# Patient Record
Sex: Female | Born: 1965
Health system: Southern US, Community
[De-identification: ages and names within clinical notes are randomized; demographics above are authoritative.]

## PROBLEM LIST (undated history)

## (undated) DIAGNOSIS — H539 Unspecified visual disturbance: Secondary | ICD-10-CM

## (undated) DIAGNOSIS — M1990 Unspecified osteoarthritis, unspecified site: Secondary | ICD-10-CM

## (undated) DIAGNOSIS — Z800 Family history of malignant neoplasm of digestive organs: Secondary | ICD-10-CM

## (undated) DIAGNOSIS — T8859XA Other complications of anesthesia, initial encounter: Secondary | ICD-10-CM

## (undated) DIAGNOSIS — I10 Essential (primary) hypertension: Secondary | ICD-10-CM

## (undated) DIAGNOSIS — R0602 Shortness of breath: Secondary | ICD-10-CM

## (undated) DIAGNOSIS — F419 Anxiety disorder, unspecified: Secondary | ICD-10-CM

## (undated) DIAGNOSIS — R002 Palpitations: Secondary | ICD-10-CM

## (undated) DIAGNOSIS — R42 Dizziness and giddiness: Secondary | ICD-10-CM

## (undated) DIAGNOSIS — Z803 Family history of malignant neoplasm of breast: Secondary | ICD-10-CM

## (undated) DIAGNOSIS — D649 Anemia, unspecified: Secondary | ICD-10-CM

## (undated) DIAGNOSIS — H9319 Tinnitus, unspecified ear: Secondary | ICD-10-CM

## (undated) DIAGNOSIS — M545 Low back pain, unspecified: Secondary | ICD-10-CM

## (undated) DIAGNOSIS — G43909 Migraine, unspecified, not intractable, without status migrainosus: Secondary | ICD-10-CM

## (undated) HISTORY — DX: Anxiety disorder, unspecified: F41.9

## (undated) HISTORY — DX: Unspecified osteoarthritis, unspecified site: M19.90

## (undated) HISTORY — DX: Migraine, unspecified, not intractable, without status migrainosus: G43.909

## (undated) HISTORY — DX: Unspecified visual disturbance: H53.9

## (undated) HISTORY — DX: Palpitations: R00.2

## (undated) HISTORY — DX: Essential (primary) hypertension: I10

## (undated) HISTORY — DX: Low back pain, unspecified: M54.50

## (undated) HISTORY — DX: Tinnitus, unspecified ear: H93.19

## (undated) HISTORY — DX: Family history of malignant neoplasm of digestive organs: Z80.0

## (undated) HISTORY — PX: TUBAL LIGATION: SHX77

## (undated) HISTORY — DX: Anemia, unspecified: D64.9

## (undated) HISTORY — DX: Dizziness and giddiness: R42

## (undated) HISTORY — DX: Family history of malignant neoplasm of breast: Z80.3

## (undated) HISTORY — PX: WISDOM TOOTH EXTRACTION: SHX21

## (undated) HISTORY — DX: Shortness of breath: R06.02

---

## 2000-01-06 ENCOUNTER — Other Ambulatory Visit: Admission: RE | Admit: 2000-01-06 | Discharge: 2000-01-06 | Payer: Self-pay | Admitting: *Deleted

## 2002-04-12 ENCOUNTER — Encounter: Admission: RE | Admit: 2002-04-12 | Discharge: 2002-04-12 | Payer: Self-pay | Admitting: Family Medicine

## 2002-04-12 ENCOUNTER — Encounter: Payer: Self-pay | Admitting: Family Medicine

## 2004-01-29 ENCOUNTER — Other Ambulatory Visit: Admission: RE | Admit: 2004-01-29 | Discharge: 2004-01-29 | Payer: Self-pay | Admitting: Gynecology

## 2004-11-18 ENCOUNTER — Ambulatory Visit (HOSPITAL_COMMUNITY): Admission: RE | Admit: 2004-11-18 | Discharge: 2004-11-18 | Payer: Self-pay | Admitting: Gynecology

## 2005-06-02 ENCOUNTER — Ambulatory Visit (HOSPITAL_COMMUNITY): Admission: RE | Admit: 2005-06-02 | Discharge: 2005-06-02 | Payer: Self-pay | Admitting: Gynecology

## 2005-06-02 ENCOUNTER — Other Ambulatory Visit: Admission: RE | Admit: 2005-06-02 | Discharge: 2005-06-02 | Payer: Self-pay | Admitting: Gynecology

## 2005-08-18 ENCOUNTER — Ambulatory Visit (HOSPITAL_COMMUNITY): Admission: RE | Admit: 2005-08-18 | Discharge: 2005-08-18 | Payer: Self-pay | Admitting: Obstetrics and Gynecology

## 2006-09-07 ENCOUNTER — Other Ambulatory Visit: Admission: RE | Admit: 2006-09-07 | Discharge: 2006-09-07 | Payer: Self-pay | Admitting: Obstetrics and Gynecology

## 2006-11-23 ENCOUNTER — Encounter: Admission: RE | Admit: 2006-11-23 | Discharge: 2006-11-23 | Payer: Self-pay | Admitting: Obstetrics and Gynecology

## 2007-08-05 ENCOUNTER — Emergency Department (HOSPITAL_COMMUNITY): Admission: EM | Admit: 2007-08-05 | Discharge: 2007-08-05 | Payer: Self-pay | Admitting: Emergency Medicine

## 2007-08-13 ENCOUNTER — Ambulatory Visit (HOSPITAL_COMMUNITY): Admission: RE | Admit: 2007-08-13 | Discharge: 2007-08-13 | Payer: Self-pay | Admitting: Emergency Medicine

## 2007-12-13 ENCOUNTER — Ambulatory Visit (HOSPITAL_COMMUNITY): Admission: RE | Admit: 2007-12-13 | Discharge: 2007-12-13 | Payer: Self-pay | Admitting: Obstetrics and Gynecology

## 2009-03-12 ENCOUNTER — Ambulatory Visit (HOSPITAL_COMMUNITY): Admission: RE | Admit: 2009-03-12 | Discharge: 2009-03-12 | Payer: Self-pay | Admitting: Obstetrics and Gynecology

## 2010-06-10 ENCOUNTER — Ambulatory Visit (HOSPITAL_COMMUNITY): Admission: RE | Admit: 2010-06-10 | Discharge: 2010-06-10 | Payer: Self-pay | Admitting: Obstetrics and Gynecology

## 2010-12-05 ENCOUNTER — Encounter
Admission: RE | Admit: 2010-12-05 | Discharge: 2010-12-05 | Payer: Self-pay | Source: Home / Self Care | Attending: Obstetrics and Gynecology | Admitting: Obstetrics and Gynecology

## 2011-01-18 ENCOUNTER — Encounter: Payer: Self-pay | Admitting: Gynecology

## 2012-01-12 ENCOUNTER — Other Ambulatory Visit (HOSPITAL_COMMUNITY): Payer: Self-pay | Admitting: Obstetrics and Gynecology

## 2012-01-12 DIAGNOSIS — Z1231 Encounter for screening mammogram for malignant neoplasm of breast: Secondary | ICD-10-CM

## 2012-02-09 ENCOUNTER — Ambulatory Visit (HOSPITAL_COMMUNITY)
Admission: RE | Admit: 2012-02-09 | Discharge: 2012-02-09 | Disposition: A | Discharge status: Home / Self Care | Payer: BC Managed Care – PPO | Source: Ambulatory Visit | Attending: Obstetrics and Gynecology | Admitting: Obstetrics and Gynecology

## 2012-02-09 DIAGNOSIS — Z1231 Encounter for screening mammogram for malignant neoplasm of breast: Secondary | ICD-10-CM | POA: Insufficient documentation

## 2013-01-21 ENCOUNTER — Other Ambulatory Visit: Payer: Self-pay | Admitting: Obstetrics and Gynecology

## 2013-01-21 DIAGNOSIS — Z1231 Encounter for screening mammogram for malignant neoplasm of breast: Secondary | ICD-10-CM

## 2013-02-21 ENCOUNTER — Ambulatory Visit
Admission: RE | Admit: 2013-02-21 | Discharge: 2013-02-21 | Disposition: A | Discharge status: Home / Self Care | Payer: BC Managed Care – PPO | Source: Ambulatory Visit | Attending: Obstetrics and Gynecology | Admitting: Obstetrics and Gynecology

## 2013-02-21 DIAGNOSIS — Z1231 Encounter for screening mammogram for malignant neoplasm of breast: Secondary | ICD-10-CM

## 2015-01-25 ENCOUNTER — Other Ambulatory Visit: Payer: Self-pay

## 2015-01-25 DIAGNOSIS — Z1231 Encounter for screening mammogram for malignant neoplasm of breast: Secondary | ICD-10-CM

## 2015-02-05 ENCOUNTER — Ambulatory Visit
Admission: RE | Admit: 2015-02-05 | Discharge: 2015-02-05 | Disposition: A | Discharge status: Home / Self Care | Payer: No Typology Code available for payment source | Source: Ambulatory Visit

## 2015-02-05 DIAGNOSIS — Z1231 Encounter for screening mammogram for malignant neoplasm of breast: Secondary | ICD-10-CM

## 2017-01-05 DIAGNOSIS — J018 Other acute sinusitis: Secondary | ICD-10-CM | POA: Diagnosis not present

## 2017-01-05 DIAGNOSIS — R05 Cough: Secondary | ICD-10-CM | POA: Diagnosis not present

## 2017-05-18 DIAGNOSIS — Z1283 Encounter for screening for malignant neoplasm of skin: Secondary | ICD-10-CM | POA: Diagnosis not present

## 2017-05-18 DIAGNOSIS — L821 Other seborrheic keratosis: Secondary | ICD-10-CM | POA: Diagnosis not present

## 2017-06-22 DIAGNOSIS — Z6825 Body mass index (BMI) 25.0-25.9, adult: Secondary | ICD-10-CM | POA: Diagnosis not present

## 2017-06-22 DIAGNOSIS — Z01419 Encounter for gynecological examination (general) (routine) without abnormal findings: Secondary | ICD-10-CM | POA: Diagnosis not present

## 2017-06-22 DIAGNOSIS — Z1231 Encounter for screening mammogram for malignant neoplasm of breast: Secondary | ICD-10-CM | POA: Diagnosis not present

## 2017-06-23 DIAGNOSIS — Z01419 Encounter for gynecological examination (general) (routine) without abnormal findings: Secondary | ICD-10-CM | POA: Diagnosis not present

## 2017-06-26 ENCOUNTER — Encounter: Payer: Self-pay | Admitting: *Deleted

## 2017-06-28 NOTE — Progress Notes (Addendum)
Cardiology Office Note   Date:  06/29/2017   ID:  Theresa Ibarra, DOB 28-Feb-1966, MRN [id number]  PCP:  Kaleen Mask, MD    No chief complaint on file. family h/o CAD   Wt Readings from Last 3 Encounters:  06/29/17 164 lb (74.4 kg)       History of Present Illness: Theresa Ibarra is a 51 y.o. female  With a family h/o CAD.   Theresa Ibarra is a 51 y.o. female who is being seen today for the evaluation of SHOB at the request of Dr. Windle Guard.  Her brother had CABG at age 61.    She has had some palpitations when she lies back on a couch.  Sx resolves quickly on their own.  No sustained palpitations.  She does not exercise regularly.  She does feels some DOE when she walks up some stairs.    She has some DOE with singing as well.    She has had sx on and off for a year.  She reported this to her GYN MD.    She has never had any heart testing.  No h/o smoking.    Past Medical History:  Diagnosis Date  . Anemia   . Arthritis   . Change in vision   . Dizziness   . Migraines   . Palpitations   . SOB (shortness of breath)     Past Surgical History:  Procedure Laterality Date  . CESAREAN SECTION  1986  . CESAREAN SECTION  1987  . TUBAL LIGATION       Current Outpatient Prescriptions  Medication Sig Dispense Refill  . Multiple Vitamins-Minerals (MULTIVITAMIN ADULT) CHEW Chew by mouth daily. Pt unsure of dosage    . sertraline (ZOLOFT) 50 MG tablet Take 50 mg by mouth daily.     No current facility-administered medications for this visit.     Allergies:   Patient has no known allergies.    Social History:  The patient  reports that she has never smoked. She has never used smokeless tobacco. She reports that she drinks alcohol. She reports that she does not use drugs.   Family History:  The patient's family history includes Arthritis in her other; Asthma in her other; Breast cancer (age of onset: 36) in her sister; CAD in her brother; Cancer in  her other; Diabetes in her other; Heart attack (age of onset: 75) in her brother; Heart disease in her other; Hypertension in her other; Migraines in her other.    ROS:  Please see the history of present illness.   Otherwise, review of systems are positive for DOE.   All other systems are reviewed and negative.    PHYSICAL EXAM: VS:  BP 130/90   Pulse 73   Ht 5\' 8"  (1.727 m)   Wt 164 lb (74.4 kg)   SpO2 99%   BMI 24.94 kg/m  , BMI Body mass index is 24.94 kg/m. GEN: Well nourished, well developed, in no acute distress  HEENT: normal  Neck: no JVD, carotid bruits, or masses Cardiac: RRR; no murmurs, rubs, or gallops,no edema  Respiratory:  clear to auscultation bilaterally, normal work of breathing GI: soft, nontender, nondistended, + BS MS: no deformity or atrophy  Skin: warm and dry, no rash Neuro:  Strength and sensation are intact Psych: euthymic mood, full affect   EKG:   The ekg ordered today demonstrates NSR, no ST segment changes   Recent Labs: No results  found for requested labs within last 8760 hours.   Lipid Panel No results found for: CHOL, TRIG, HDL, CHOLHDL, VLDL, LDLCALC, LDLDIRECT   Other studies Reviewed: Additional studies/ records that were reviewed today with results demonstrating: OB/GYN notes reviewed. .   ASSESSMENT AND PLAN:  1. DOE:  Will eval for ischemia with ETT.  ECG is normal.  May be due to deconditioning.  Will need to increase exercise, goal as noted below once cleared by stress test.  2. Family history of CAD: She is trying to be more preventive in her care.  Aggressive risk factor modification including management of lipids, healthy diet and increasing exercise will be paramount. 3. Screening for lipid disorders: She will go to Dr. Shelah LewandowskyElkin to give blood for this. She eats a healthy diet for the most part.   Current medicines are reviewed at length with the patient today.  The patient concerns regarding her medicines were  addressed.  The following changes have been made:  No change  Labs/ tests ordered today include: plan for ETT No orders of the defined types were placed in this encounter.   Recommend 150 minutes/week of aerobic exercise Low fat, low carb, high fiber diet recommended  Disposition:   FU for ETT   Signed, Lance MussJayadeep Gabrien Mentink, MD  06/29/2017 10:56 AM    [facility] [address], [place], [place]  [address] Phone: [phone]; Fax: [phone]

## 2017-06-29 ENCOUNTER — Encounter (INDEPENDENT_AMBULATORY_CARE_PROVIDER_SITE_OTHER): Payer: Self-pay

## 2017-06-29 ENCOUNTER — Ambulatory Visit (INDEPENDENT_AMBULATORY_CARE_PROVIDER_SITE_OTHER): Payer: BLUE CROSS/BLUE SHIELD | Admitting: Interventional Cardiology

## 2017-06-29 ENCOUNTER — Encounter: Payer: Self-pay | Admitting: Interventional Cardiology

## 2017-06-29 VITALS — BP 130/90 | HR 73 | Ht 68.0 in | Wt 164.0 lb

## 2017-06-29 DIAGNOSIS — R0609 Other forms of dyspnea: Secondary | ICD-10-CM

## 2017-06-29 DIAGNOSIS — Z13220 Encounter for screening for lipoid disorders: Secondary | ICD-10-CM | POA: Diagnosis not present

## 2017-06-29 DIAGNOSIS — Z8249 Family history of ischemic heart disease and other diseases of the circulatory system: Secondary | ICD-10-CM | POA: Diagnosis not present

## 2017-06-29 DIAGNOSIS — R0600 Dyspnea, unspecified: Secondary | ICD-10-CM

## 2017-06-29 NOTE — Patient Instructions (Signed)
Medication Instructions:  Your physician recommends that you continue on your current medications as directed. Please refer to the Current Medication list given to you today.   Labwork: None ordered  Testing/Procedures: Your physician has requested that you have an exercise tolerance test. For further information please visit [email or web address]. Please also follow instruction sheet, as given.   Follow-Up: Your physician wants you to follow-up in: 6 months with Dr. Eldridge DaceVaranasi. You will receive a reminder letter in the mail two months in advance. If you don't receive a letter, please call our office to schedule the follow-up appointment.   Any Other Special Instructions Will Be Listed Below (If Applicable).     If you need a refill on your cardiac medications before your next appointment, please call your pharmacy.

## 2017-07-13 DIAGNOSIS — Z13820 Encounter for screening for osteoporosis: Secondary | ICD-10-CM | POA: Diagnosis not present

## 2017-07-23 ENCOUNTER — Ambulatory Visit (INDEPENDENT_AMBULATORY_CARE_PROVIDER_SITE_OTHER): Payer: BLUE CROSS/BLUE SHIELD

## 2017-07-23 DIAGNOSIS — R0609 Other forms of dyspnea: Secondary | ICD-10-CM | POA: Diagnosis not present

## 2017-07-23 DIAGNOSIS — R0600 Dyspnea, unspecified: Secondary | ICD-10-CM

## 2017-07-23 LAB — EXERCISE TOLERANCE TEST
CSEPED: 9 min
CSEPEDS: 9 s
CSEPHR: 79 %
Estimated workload: 10.3 METS
MPHR: 167 {beats}/min
Peak HR: 133 {beats}/min
RPE: 15
Rest HR: 70 {beats}/min

## 2017-07-24 ENCOUNTER — Telehealth: Payer: Self-pay | Admitting: Family Medicine

## 2017-07-24 ENCOUNTER — Encounter: Payer: Self-pay | Admitting: Genetic Counselor

## 2017-07-24 ENCOUNTER — Telehealth: Payer: Self-pay | Admitting: Interventional Cardiology

## 2017-07-24 ENCOUNTER — Telehealth: Payer: Self-pay | Admitting: Genetic Counselor

## 2017-07-24 NOTE — Telephone Encounter (Signed)
Patient made aware of results. Patient verbalizes understanding. Patient states that she has not had any symptoms since the last visit and that she did not have any symptoms during the test.  Patient states that she has bad knees and that her knees just gave out.

## 2017-07-24 NOTE — Telephone Encounter (Signed)
-----   Message from Corky CraftsJayadeep S Varanasi, MD sent at 07/24/2017 12:51 AM EDT ----- No ischemia at the heart rate achieved, but she did not achieve target heart rate.  Any further sx since last visit, or during the stress test.?

## 2017-07-24 NOTE — Telephone Encounter (Signed)
Genetic counseling appt has been scheduled for the pt to see Clydie BraunKaren on 8/27 at 1pm. Address and insurance verified. Letter mailed.

## 2017-07-24 NOTE — Telephone Encounter (Signed)
F/u message  Pt returning RN call about TEE results. Please call back to discuss

## 2017-07-28 DIAGNOSIS — R1033 Periumbilical pain: Secondary | ICD-10-CM | POA: Diagnosis not present

## 2017-07-28 DIAGNOSIS — Z1211 Encounter for screening for malignant neoplasm of colon: Secondary | ICD-10-CM | POA: Diagnosis not present

## 2017-07-28 DIAGNOSIS — Z800 Family history of malignant neoplasm of digestive organs: Secondary | ICD-10-CM | POA: Diagnosis not present

## 2017-07-28 DIAGNOSIS — R1310 Dysphagia, unspecified: Secondary | ICD-10-CM | POA: Diagnosis not present

## 2017-08-24 ENCOUNTER — Other Ambulatory Visit: Payer: BLUE CROSS/BLUE SHIELD

## 2017-08-24 ENCOUNTER — Encounter: Payer: Self-pay | Admitting: Genetic Counselor

## 2017-08-24 ENCOUNTER — Ambulatory Visit (HOSPITAL_BASED_OUTPATIENT_CLINIC_OR_DEPARTMENT_OTHER): Payer: BLUE CROSS/BLUE SHIELD | Admitting: Genetic Counselor

## 2017-08-24 DIAGNOSIS — Z803 Family history of malignant neoplasm of breast: Secondary | ICD-10-CM | POA: Insufficient documentation

## 2017-08-24 DIAGNOSIS — Z809 Family history of malignant neoplasm, unspecified: Secondary | ICD-10-CM | POA: Diagnosis not present

## 2017-08-24 DIAGNOSIS — Z315 Encounter for genetic counseling: Secondary | ICD-10-CM | POA: Diagnosis not present

## 2017-08-24 DIAGNOSIS — Z800 Family history of malignant neoplasm of digestive organs: Secondary | ICD-10-CM

## 2017-08-24 NOTE — Progress Notes (Signed)
REFERRING PROVIDER: Juanda Chance, NP [address], [place], [place] [address]  PRIMARY PROVIDER:  Leonard Downing, MD  PRIMARY REASON FOR VISIT:  1. Family history of breast cancer   2. Family history of colon cancer      HISTORY OF PRESENT ILLNESS:   Theresa Ibarra, a 51 y.o. female, was seen for a [place] cancer genetics consultation at the request of Dr. Vicente Serene due to a family history of cancer.  Theresa Ibarra presents to clinic today to discuss the possibility of a hereditary predisposition to cancer, genetic testing, and to further clarify her future cancer risks, as well as potential cancer risks for family members. Theresa Ibarra is a 51 y.o. female with no personal history of cancer.    CANCER HISTORY:   No history exists.     HORMONAL RISK FACTORS:  Menarche was at age 32.  First live birth at age 19.  OCP use for approximately 5 years.  Ovaries intact: yes.  Hysterectomy: no.  Menopausal status: postmenopausal.  HRT use: 0 years. Colonoscopy: Not examined yet.  Scheduled for 08/2017. Mammogram within the last year: yes. Number of breast biopsies: 1. Up to date with pelvic exams:  yes. Any excessive radiation exposure in the past:  no  Past Medical History:  Diagnosis Date  . Anemia   . Arthritis   . Change in vision   . Dizziness   . Family history of breast cancer   . Family history of colon cancer   . Migraines   . Palpitations   . SOB (shortness of breath)     Past Surgical History:  Procedure Laterality Date  . CESAREAN SECTION  1986  . CESAREAN SECTION  1987  . TUBAL LIGATION      Social History   Social History  . Marital status: Married    Spouse name: N/A  . Number of children: N/A  . Years of education: N/A   Social History Main Topics  . Smoking status: Never Smoker  . Smokeless tobacco: Never Used  . Alcohol use Yes     Comment: occasional  . Drug use: No  . Sexual activity: Yes     Comment: MARRIED   Other  Topics Concern  . None   Social History Narrative  . None     FAMILY HISTORY:  We obtained a detailed, 4-generation family history.  Significant diagnoses are listed below: Family History  Problem Relation Age of Onset  . Breast cancer Sister 47       intraductal  . CAD Brother   . Heart attack Brother 75  . Diabetes Other   . Heart disease Other   . Asthma Other   . Arthritis Other   . Hypertension Other   . Cancer Other   . Migraines Other   . Other Mother 63       tractor accident  . COPD Father   . Alzheimer's disease Maternal Aunt   . Other Maternal Uncle        infant death  . Breast cancer Paternal Aunt   . Colon cancer Paternal Uncle   . Breast cancer Paternal Grandmother   . Stroke Sister 39    The patient has two sons who are cancer free.  She has two sisters and a brother.  One sister was diagnosed with breast cancer at 7, and one sister died last year of a stroke.  Her brother had a heart attack and triple bypass surgery  in his 75's.  Both parents are deceased.  The patient's mother died in a tractor accident at 32.  She had not had cancer before her death.,  She had a brother who died in infancy and a sister who currently has dementia.  There is no reported family history of cancer on the maternal side.  The patient's father died at 35 from COPD.  He had 11 siblings.  Four brothers had colon cancer and one sister (at least) had breast cancer.  The patient's grandmother had breast cancer, and her grandfather died of unknown causes.  Theresa Ibarra is unaware of previous family history of genetic testing for hereditary cancer risks. Patient's maternal ancestors are of [place] descent, and paternal ancestors are of Caucasian descent. There is no reported Ashkenazi Jewish ancestry. There is no known consanguinity.  GENETIC COUNSELING ASSESSMENT: Theresa Ibarra is a 51 y.o. female with a family history of breast and colon cancer which is somewhat suggestive of a  hereditary cancer syndrome and predisposition to cancer. We, therefore, discussed and recommended the following at today's visit.   DISCUSSION: We discussed that about 5-10% of breast cancer is hereditary, and about 5-6% of colon cancer is hereditary.  Most cases of breast cancer are due to BRCA mutations, but some can be part of other cancer syndromes resulting in changes in ATM, CHEK2 or PALB2.  We reviewed the characteristics, features and inheritance patterns of hereditary cancer syndromes. We also discussed genetic testing, including the appropriate family members to test, the process of testing, insurance coverage and turn-around-time for results. We discussed the implications of a negative, positive and/or variant of uncertain significant result. Based on Theresa Ibarra's family history, her sister would be a better person to undergo genetic testing.  If her sister were positive, then we would know what is happening in the family and more easily test Theresa Ibarra.  If her sister were negative, then the likelihood that we would find something in Theresa Ibarra is lower.  We recommended Theresa Ibarra pursue genetic testing for the common hereditary cancer gene panel. The Hereditary Gene Panel offered by [facility] includes sequencing and/or deletion duplication testing of the following 46 genes: APC, ATM, AXIN2, BARD1, BMPR1A, BRCA1, BRCA2, BRIP1, CDH1, CDKN2A (p14ARF), CDKN2A (p16INK4a), CHEK2, CTNNA1, DICER1, EPCAM (Deletion/duplication testing only), GREM1 (promoter region deletion/duplication testing only), KIT, MEN1, MLH1, MSH2, MSH3, MSH6, MUTYH, NBN, NF1, NHTL1, PALB2, PDGFRA, PMS2, POLD1, POLE, PTEN, RAD50, RAD51C, RAD51D, SDHB, SDHC, SDHD, SMAD4, SMARCA4. STK11, TP53, TSC1, TSC2, and VHL.  The following genes were evaluated for sequence changes only: SDHA and HOXB13 c.251G>A variant only.    Based on Theresa Ibarra's family history of cancer, she meets medical criteria for genetic testing. Despite that she meets  criteria, she may still have an out of pocket cost. We discussed that if her out of pocket cost for testing is over $100, the laboratory will call and confirm whether she wants to proceed with testing.  If the out of pocket cost of testing is less than $100 she will be billed by the genetic testing laboratory.   PLAN: Despite our recommendation, Theresa Ibarra did not wish to pursue genetic testing at today's visit. We understand this decision, and remain available to coordinate genetic testing at any time in the future. She feels that she would like to see if her sister would undergo genetic testing.  Theresa Ibarra will let us know if we can schedule her sister or if we need to r/s her blood  draw.  We; therefore, recommend Theresa Ibarra continue to follow the cancer screening guidelines given by her primary healthcare provider.  Lastly, we encouraged Theresa Ibarra to remain in contact with cancer genetics annually so that we can continuously update the family history and inform her of any changes in cancer genetics and testing that may be of benefit for this family.   Ms.  Ibarra questions were answered to her satisfaction today. Our contact information was provided should additional questions or concerns arise. Thank you for the referral and allowing [place] to share in the care of your patient.   Duayne Brideau P. Florene Glen, [place], [facility] Certified Genetic Counselor [email or web address] phone: [phone]  The patient was seen for a total of 55 minutes in face-to-face genetic counseling.  This patient was discussed with Drs. Magrinat, Lindi Adie and/or Burr Medico who agrees with the above.    _______________________________________________________________________ For Office Staff:  Number of people involved in session: 1 Was an Intern/ student involved with case: yes: Rise Paganini

## 2017-09-15 DIAGNOSIS — M79672 Pain in left foot: Secondary | ICD-10-CM | POA: Diagnosis not present

## 2017-10-02 DIAGNOSIS — R5383 Other fatigue: Secondary | ICD-10-CM | POA: Diagnosis not present

## 2017-11-04 DIAGNOSIS — N926 Irregular menstruation, unspecified: Secondary | ICD-10-CM | POA: Diagnosis not present

## 2018-07-28 DIAGNOSIS — Z01419 Encounter for gynecological examination (general) (routine) without abnormal findings: Secondary | ICD-10-CM | POA: Diagnosis not present

## 2018-07-28 DIAGNOSIS — Z6825 Body mass index (BMI) 25.0-25.9, adult: Secondary | ICD-10-CM | POA: Diagnosis not present

## 2018-07-29 ENCOUNTER — Other Ambulatory Visit: Payer: Self-pay | Admitting: Obstetrics & Gynecology

## 2018-07-29 DIAGNOSIS — N644 Mastodynia: Secondary | ICD-10-CM

## 2018-08-04 ENCOUNTER — Ambulatory Visit
Admission: RE | Admit: 2018-08-04 | Discharge: 2018-08-04 | Disposition: A | Discharge status: Home / Self Care | Payer: BLUE CROSS/BLUE SHIELD | Source: Ambulatory Visit | Attending: Obstetrics & Gynecology | Admitting: Obstetrics & Gynecology

## 2018-08-04 ENCOUNTER — Ambulatory Visit: Payer: BLUE CROSS/BLUE SHIELD

## 2018-08-04 DIAGNOSIS — N644 Mastodynia: Secondary | ICD-10-CM

## 2018-08-04 DIAGNOSIS — R928 Other abnormal and inconclusive findings on diagnostic imaging of breast: Secondary | ICD-10-CM | POA: Diagnosis not present

## 2018-08-09 DIAGNOSIS — M542 Cervicalgia: Secondary | ICD-10-CM | POA: Diagnosis not present

## 2018-08-19 DIAGNOSIS — Z79890 Hormone replacement therapy (postmenopausal): Secondary | ICD-10-CM | POA: Diagnosis not present

## 2018-09-13 DIAGNOSIS — I10 Essential (primary) hypertension: Secondary | ICD-10-CM | POA: Diagnosis not present

## 2018-09-13 DIAGNOSIS — M1990 Unspecified osteoarthritis, unspecified site: Secondary | ICD-10-CM | POA: Diagnosis not present

## 2018-09-13 DIAGNOSIS — Z803 Family history of malignant neoplasm of breast: Secondary | ICD-10-CM | POA: Diagnosis not present

## 2018-09-13 DIAGNOSIS — F411 Generalized anxiety disorder: Secondary | ICD-10-CM | POA: Diagnosis not present

## 2018-10-04 DIAGNOSIS — R030 Elevated blood-pressure reading, without diagnosis of hypertension: Secondary | ICD-10-CM | POA: Diagnosis not present

## 2018-10-04 DIAGNOSIS — J0140 Acute pansinusitis, unspecified: Secondary | ICD-10-CM | POA: Diagnosis not present

## 2018-10-08 DIAGNOSIS — Z6825 Body mass index (BMI) 25.0-25.9, adult: Secondary | ICD-10-CM | POA: Diagnosis not present

## 2018-10-08 DIAGNOSIS — R42 Dizziness and giddiness: Secondary | ICD-10-CM | POA: Diagnosis not present

## 2018-10-08 DIAGNOSIS — I10 Essential (primary) hypertension: Secondary | ICD-10-CM | POA: Diagnosis not present

## 2018-10-28 ENCOUNTER — Ambulatory Visit: Payer: BLUE CROSS/BLUE SHIELD | Admitting: Podiatry

## 2018-10-28 ENCOUNTER — Ambulatory Visit (INDEPENDENT_AMBULATORY_CARE_PROVIDER_SITE_OTHER): Payer: BLUE CROSS/BLUE SHIELD

## 2018-10-28 ENCOUNTER — Encounter: Payer: Self-pay | Admitting: Podiatry

## 2018-10-28 VITALS — BP 171/85 | HR 70

## 2018-10-28 DIAGNOSIS — M778 Other enthesopathies, not elsewhere classified: Secondary | ICD-10-CM

## 2018-10-28 DIAGNOSIS — M779 Enthesopathy, unspecified: Secondary | ICD-10-CM

## 2018-10-28 MED ORDER — TRIAMCINOLONE ACETONIDE 10 MG/ML IJ SUSP
10.0000 mg | Freq: Once | INTRAMUSCULAR | Status: AC
  Administered 2018-10-28: 10 mg

## 2018-10-28 NOTE — Progress Notes (Signed)
Subjective:   Patient ID: Theresa Ibarra, female   DOB: 52 y.o.   MRN: [id number]   HPI Patient presents with pain in the left second metatarsal of a year and a half duration and states that she was very active on her foot prior to this starting and it is worse when she walks and after standing and patient does not smoke and likes to be active   Review of Systems  All other systems reviewed and are negative.       Objective:  Physical Exam  Constitutional: She appears well-developed and well-nourished.  Cardiovascular: Intact distal pulses.  Pulmonary/Chest: Effort normal.  Musculoskeletal: Normal range of motion.  Neurological: She is alert.  Skin: Skin is warm.  Nursing note and vitals reviewed.   Neurovascular status intact muscle strength is adequate range of motion within normal limits with exquisite discomfort second MPJ left with inflammation fluid around the joint surface and pain upon palpation.  Patient does not have digital movement and was found to have good digital perfusion well oriented x3     Assessment:  Appears to be acute capsulitis second MPJ left even though there is chronic nature due to long-standing addition     Plan:  H&P x-ray reviewed condition discussed.  I did proximal nerve block sterile prep of the area I then aspirated the second MPJ getting out a small amount of a pink type fluid and injected quarter cc Dexasone Kenalog into the joint and applied thick plantar pad reduce pressure on the joint surface.  Discussed possible orthotics long-term and discussed rigid bottom shoes  X-ray indicates mild elongation second metatarsal segment with no indications of arthritis or stress fracture

## 2018-11-01 ENCOUNTER — Other Ambulatory Visit: Payer: Self-pay

## 2018-11-01 ENCOUNTER — Ambulatory Visit: Payer: BLUE CROSS/BLUE SHIELD | Attending: Family Medicine | Admitting: Physical Therapy

## 2018-11-01 ENCOUNTER — Encounter: Payer: Self-pay | Admitting: Physical Therapy

## 2018-11-01 VITALS — BP 144/98

## 2018-11-01 DIAGNOSIS — R42 Dizziness and giddiness: Secondary | ICD-10-CM | POA: Diagnosis not present

## 2018-11-11 NOTE — Therapy (Signed)
[facility] [address] [place], [place], [address] Phone: [phone]   Fax:  [phone]  Physical Therapy Evaluation  Patient Details  Name: Theresa Ibarra MRN: [id number] Date of Birth: 04/06/1966 Referring Provider (PT):  Tisovec, Adelfa Kohichard W, MD   Encounter Date: 11/01/2018     11/01/18 2125  PT Visits / Re-Eval  Visit Number 1  Number of Visits 1  Authorization  Authorization Type BCBS  PT Time Calculation  PT Start Time 1440  PT Stop Time 1534  PT Time Calculation (min) 54 min  PT - End of Session  Activity Tolerance Patient tolerated treatment well  Behavior During Therapy [facility] for tasks assessed/performed    Past Medical History:  Diagnosis Date  . Anemia   . Arthritis   . Change in vision   . Dizziness   . Family history of breast cancer   . Family history of colon cancer   . Migraines   . Palpitations   . SOB (shortness of breath)     Past Surgical History:  Procedure Laterality Date  . CESAREAN SECTION  1986  . CESAREAN SECTION  1987  . TUBAL LIGATION      Vitals:   11/01/18 1524 11/01/18 1525 11/01/18 1526 11/01/18 1527  BP: (!) 154/88 (!) 170/104 (!) 154/96 (!) 144/98        11/01/18 1447  Symptoms/Limitations  Subjective I don't notice that I get dizzy when I stand up. Eventhough my BP drops, I don't feel anything. 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 Ringing in ears and hearing loss in rt ear began 5 yrs ago. ?Bells Palsy (had rt side of face numb). Numbness improved, hearing improved but not to normal , still has ringing. In early October, began having sense of spinning lasting seconds (especially when turning head left or when driving).  Has been having high blood pressure (170s-190s/90s). Leaning to the left when walking.   Pertinent History OA, anxiety, HTN,   Pain Assessment  Currently in Pain? No/denies        11/01/18 1453  Assessment  Medical Diagnosis   Vertigo  Referring Provider (PT)  Tisovec, Adelfa Kohichard W, MD  Onset Date/Surgical Date  (vertigo began early October; had sinus infection & started )  Prior Therapy none for vertigo  Precautions  Precautions Fall  Restrictions  Weight Bearing Restrictions No  Balance Screen  Has the patient had a decrease in activity level because of a fear of falling?  No  Is the patient reluctant to leave their home because of a fear of falling?  No  Home Teaching laboratory techniciannvironment  Living Environment Private residence  Living Arrangements Spouse/significant other  Prior Function  Level of Independence Independent  Vocation Full time employment  Vocation Requirements hairdresser  Cognition  Overall Cognitive Status Within Functional Limits for tasks assessed  Posture/Postural Control  Posture/Postural Control No significant limitations  Ambulation/Gait  Ambulation/Gait Yes  Ambulation/Gait Assistance 7: Independent  Ambulation Distance (Feet) 50 Feet (35, 35)  Assistive device None  Gait Pattern WFL  Ambulation Surface Level      11/01/18 1500  Vestibular Assessment  General Observation reports gets ~5 sec bouts of vertigo, moving head to the left, watch a car drive past her when seated at stoplight  Symptom Behavior  Type of Dizziness "World moves" (floats)  Frequency of Dizziness nearly every day  Duration of Dizziness 5 sec  Aggravating Factors Driving;Turning head sideways (turn left)  Relieving Factors Head stationary  Occulomotor Exam  Occulomotor Alignment Normal  Spontaneous Absent  Gaze-induced Absent  Smooth Pursuits Intact  Saccades Dysmetria (center to rt, rt eye dysmetria)  Vestibulo-Occular Reflex  VOR Cancellation Normal  Comment HIT normal   Visual Acuity  Static 8  Dynamic 8  Auditory  Comments decr hearing rt x 5 years  Positional Testing  Dix-Hallpike Dix-Hallpike Right;Dix-Hallpike Left  Horizontal Canal Testing Horizontal Canal Right;Horizontal Canal Left;Horizontal  Canal Right Intensity;Horizontal Canal Left Intensity  Dix-Hallpike Right  Dix-Hallpike Right Duration 0  Dix-Hallpike Right Symptoms No nystagmus  Dix-Hallpike Left  Dix-Hallpike Left Duration 0  Dix-Hallpike Left Symptoms No nystagmus  Horizontal Canal Right  Horizontal Canal Right Duration 0  Horizontal Canal Right Symptoms Normal  Horizontal Canal Left  Horizontal Canal Left Duration 0   Horizontal Canal Left Symptoms Normal  Orthostatics  BP supine (x 5 minutes)  (see vitals section)             Objective measurements completed on examination: See above findings.        11/01/18 2127  Plan  Clinical Impression Statement Patient referred for OPPT for vertigo. 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 She denies a sense of spinning, describing it as a floating sensation. She reports it is triggered at times by turning head left (especially when she is driving).  The brief duration of the symptoms would be consistent with a peripheral vestibular dysfunction, however we were unable to elicit the dizziness during vestibular evaluation today.  Patient reports she is to have further medical workup for her elevated blood pressure. Agrees with no further PT needs at this time. Patient to be discharged from PT.   History and Personal Factors relevant to plan of care: PMH-OA, anxiety, HTN, (recent orthostasis)  Clinical Presentation Evolving  Clinical Presentation due to: Unclear cause of dizziness  Clinical Decision Making Low  Pt will benefit from skilled therapeutic intervention in order to improve on the following deficits  (NA no follow-up PT planned)  Rehab Potential  (NA no follow-up PT planned)  PT Frequency One time visit  PT Treatment/Interventions  (NA no follow-up PT planned)  Consulted and Agree with Plan of Care Patient                           Patient will benefit from skilled therapeutic intervention in order to improve the following deficits and impairments:  (NA no follow-up PT planned)  Visit Diagnosis: Dizziness and giddiness - Plan: PT plan of care cert/re-cert     Problem List Patient Active Problem List   Diagnosis Date Noted  . Family history of breast cancer   . Family history of colon cancer   . Family history of coronary arteriosclerosis 06/29/2017    Zena Amos, PT 11/11/2018, 2:31 PM  [facility] [address] [place], [place], [address] Phone: [phone]   Fax:  [phone]  Name: Theresa Ibarra MRN: [id number] Date of Birth: 09/23/1966

## 2018-11-19 ENCOUNTER — Ambulatory Visit: Payer: BLUE CROSS/BLUE SHIELD | Admitting: Podiatry

## 2019-09-12 DIAGNOSIS — Z0000 Encounter for general adult medical examination without abnormal findings: Secondary | ICD-10-CM | POA: Diagnosis not present

## 2019-09-19 DIAGNOSIS — Z1331 Encounter for screening for depression: Secondary | ICD-10-CM | POA: Diagnosis not present

## 2019-09-19 DIAGNOSIS — Z803 Family history of malignant neoplasm of breast: Secondary | ICD-10-CM | POA: Diagnosis not present

## 2019-09-19 DIAGNOSIS — M1990 Unspecified osteoarthritis, unspecified site: Secondary | ICD-10-CM | POA: Diagnosis not present

## 2019-09-19 DIAGNOSIS — R42 Dizziness and giddiness: Secondary | ICD-10-CM | POA: Diagnosis not present

## 2019-09-19 DIAGNOSIS — Z0000 Encounter for general adult medical examination without abnormal findings: Secondary | ICD-10-CM | POA: Diagnosis not present

## 2019-09-19 DIAGNOSIS — I10 Essential (primary) hypertension: Secondary | ICD-10-CM | POA: Diagnosis not present

## 2019-12-02 DIAGNOSIS — R05 Cough: Secondary | ICD-10-CM | POA: Diagnosis not present

## 2019-12-02 DIAGNOSIS — Z20828 Contact with and (suspected) exposure to other viral communicable diseases: Secondary | ICD-10-CM | POA: Diagnosis not present

## 2019-12-02 DIAGNOSIS — J3489 Other specified disorders of nose and nasal sinuses: Secondary | ICD-10-CM | POA: Diagnosis not present

## 2020-03-08 ENCOUNTER — Encounter: Payer: Self-pay | Admitting: Interventional Cardiology

## 2020-06-26 ENCOUNTER — Ambulatory Visit: Payer: Self-pay | Admitting: Orthopedic Surgery

## 2020-07-27 ENCOUNTER — Ambulatory Visit: Payer: Self-pay | Admitting: Orthopedic Surgery

## 2020-07-27 NOTE — H&P (Signed)
Subjective:   Theresa Ibarra is a pleasant, Relatively healthy, 54 year old female whose chief complaint is long-standing low back pain and radicular left leg pain that has been going on for approximately 1 year. Unfortunately, she is failed to improve despite appropriate conservative care including physical therapy and injection therapy and therefore she is expressing his desire to move forward with surgical intervention. The patient is here today for a pre-operative History and Physical. They are scheduled for left Microdiscectomy- L5-S1 on 08-08-20 with Dr. Shon Baton at [facility]  Patient Active Problem List   Diagnosis Date Noted  . Family history of breast cancer   . Family history of colon cancer   . Family history of coronary arteriosclerosis 06/29/2017   Past Medical History:  Diagnosis Date  . Anemia   . Arthritis   . Change in vision   . Dizziness   . Family history of breast cancer   . Family history of colon cancer   . Migraines   . Palpitations   . SOB (shortness of breath)     Past Surgical History:  Procedure Laterality Date  . CESAREAN SECTION  1986  . CESAREAN SECTION  1987  . TUBAL LIGATION      Current Outpatient Medications  Medication Sig Dispense Refill Last Dose  . fluticasone (FLONASE) 50 MCG/ACT nasal spray Place 1 spray into both nostrils daily as needed for allergies.     . naproxen sodium (ALEVE) 220 MG tablet Take 220-440 mg by mouth See admin instructions. Take 440 mg in the morning and 220 mg at night     . sertraline (ZOLOFT) 50 MG tablet Take 50 mg by mouth daily.      No current facility-administered medications for this visit.   Allergies  Allergen Reactions  . Gabapentin Nausea And Vomiting    Social History   Tobacco Use  . Smoking status: Never Smoker  . Smokeless tobacco: Never Used  Substance Use Topics  . Alcohol use: Yes    Comment: occasional    Family History  Problem Relation Age of Onset  . Breast cancer Sister 62        intraductal  . CAD Brother   . Heart attack Brother 56  . Diabetes Other   . Heart disease Other   . Asthma Other   . Arthritis Other   . Hypertension Other   . Cancer Other   . Migraines Other   . Other Mother 33       tractor accident  . COPD Father   . Alzheimer's disease Maternal Aunt   . Other Maternal Uncle        infant death  . Breast cancer Paternal Aunt   . Colon cancer Paternal Uncle   . Breast cancer Paternal Grandmother   . Stroke Sister 48    Review of Systems As stated in HPI  Objective:   General: AAOX3, well developed and well nourished, NAD  Ambulation: normal gait pattern, uses no assistive device.  Inspection: No obvious deformity, scoleosis, kyphosis, loss of lordotic curve.  Palpation: Non-tender over spinous processes.  Heart: Regular rate and rhythm, no rubs, murmurs, or gallops  Lungs: Clear to auscultation bilaterally  Abdomen: Bowel sounds 4, nondistended, nontender, no rebound tenderness, no loss of bladder or bowel control.  AROM: - Mild pain with ROM of low back pain. - Knee: flexion and extension normal and pain free bilaterally. - Ankle: Dorsiflexion, plantarflexion, inversion, eversion normal and pain free.  Dermatomes: Lower extremity sensation  to light touch intact bilaterally with complaints of pain and dysethesias in the left L5/S1 dermatome pattern.  Myotomes: - Hip Flexion: Left 5/5, Right 5/5 - Knee Extension: Left 5/5, Right 5/5 - Ankle Dorsiflextion: Left 5/5, Right 5/5 - Ankle Plantarflexion: Left 5/5, Right 5/5  Reflexes: - Babinski: Left Ngative, Right Negative - Clonus: Negative  Special Tests: - Straight Leg Raise: Left Negative, Right Negative  PV: Extremities warm and well profused. Posterior and dorsalis pedis pulse 2+ bilaterally, No pitting Edema, discoloration, calf tenderness, or palpable cords. Homan's negative bilaterally.  X-Ray impression: I did review AP and lateral lumbar spine films from  05/18/20. The patient does have mild scoliosis. Normal sagittal alignment, no significant slip. Disc space heights are well maintained.  MRI Impression: MRI of lumbar spine performed at [facility] dated April 12, 2020 shows a disc protrusion at L5-S1 asymmetric to the left with impingement and moderate displacement of the descending left S1 nerve root. L3-4 there is also neural foraminal narrowing with minimal displacement of the descending L4 nerve root. Mild scoliosis  Assessment:   Asyria is a pleasant, Relatively healthy, 54 year old female whose chief complaint is long-standing low back pain and radicular left leg pain that has been going on for approximately 1 year. Unfortunately, she is failed to improve despite appropriate conservative care including physical therapy and injection therapy and therefore she is expressing his desire to move forward with surgical intervention. She is scheduled for left L5-S1 lumbar discectomy with Dr. Shon Baton at [facility] on 08/08/2020   Plan:   Risks and benefits of surgery were discussed with the patient. These include: Infection, bleeding, death, stroke, paralysis, ongoing or worse pain, need for additional surgery, leak of spinal fluid, adjacent segment degeneration requiring additional surgery, post-operative hematoma formation that can result in neurological compromise and the need for urgent/emergent re-operation. Loss in bowel and bladder control. Injury to major vessels that could result in the need for urgent abdominal surgery to stop bleeding. Risk of deep venous thrombosis (DVT) and the need for additional treatment. Recurrent disc herniation resulting in the need for revision surgery, which could include fusion surgery (utilizing instrumentation such as pedicle screws and intervertebral cages). Additional risk: If instrumentation is used there is a risk of migration, or breakage of that hardware that could require additional surgery.  We have  also discussed the post-operative recovery period to include: bathing/showering restrictions, wound healing, activity (and driving) restrictions, medications/pain mangement.  We have also discussed post-operative redflags to include: signs and symptoms of postoperative infection, DVT/PE.  We have obtained preoperative medical primary care provider  I have reviewed the patient's medication list with her. She is not on any blood thinners or aspirin. She is not taking any anti-inflammatory medications. I did inform her that she should not take any anti-inflammatory medications or aspirin products leading up to surgery. She did express understanding of this.  Patient has LSO brace that she just got fitted for at PT.  Patient has preop testing scheduled with [facility].  All patients questions were invited and answered.  Follow-up: 2 weeks postop

## 2020-07-27 NOTE — H&P (Deleted)
  The note originally documented on this encounter has been moved the the encounter in which it belongs.  

## 2020-08-07 ENCOUNTER — Ambulatory Visit (HOSPITAL_COMMUNITY)
Admission: RE | Admit: 2020-08-07 | Discharge: 2020-08-07 | Disposition: A | Discharge status: Home / Self Care | Payer: 59 | Source: Ambulatory Visit | Attending: Orthopedic Surgery | Admitting: Orthopedic Surgery

## 2020-08-07 ENCOUNTER — Encounter (HOSPITAL_COMMUNITY)
Admission: RE | Admit: 2020-08-07 | Discharge: 2020-08-07 | Disposition: A | Discharge status: Home / Self Care | Payer: 59 | Source: Ambulatory Visit | Attending: Orthopedic Surgery | Admitting: Orthopedic Surgery

## 2020-08-07 ENCOUNTER — Encounter (HOSPITAL_COMMUNITY): Payer: Self-pay

## 2020-08-07 ENCOUNTER — Other Ambulatory Visit (HOSPITAL_COMMUNITY)
Admission: RE | Admit: 2020-08-07 | Discharge: 2020-08-07 | Disposition: A | Discharge status: Home / Self Care | Payer: 59 | Source: Ambulatory Visit | Attending: Orthopedic Surgery | Admitting: Orthopedic Surgery

## 2020-08-07 ENCOUNTER — Other Ambulatory Visit: Payer: Self-pay

## 2020-08-07 DIAGNOSIS — Z20822 Contact with and (suspected) exposure to covid-19: Secondary | ICD-10-CM | POA: Diagnosis not present

## 2020-08-07 DIAGNOSIS — Z01818 Encounter for other preprocedural examination: Secondary | ICD-10-CM | POA: Insufficient documentation

## 2020-08-07 LAB — URINALYSIS, ROUTINE W REFLEX MICROSCOPIC
Bacteria, UA: NONE SEEN
Bilirubin Urine: NEGATIVE
Glucose, UA: NEGATIVE mg/dL
Hgb urine dipstick: NEGATIVE
Ketones, ur: NEGATIVE mg/dL
Nitrite: NEGATIVE
Protein, ur: NEGATIVE mg/dL
Specific Gravity, Urine: 1.012 (ref 1.005–1.030)
pH: 7 (ref 5.0–8.0)

## 2020-08-07 LAB — PROTIME-INR
INR: 1 (ref 0.8–1.2)
Prothrombin Time: 13 seconds (ref 11.4–15.2)

## 2020-08-07 LAB — APTT: aPTT: 27 seconds (ref 24–36)

## 2020-08-07 LAB — BASIC METABOLIC PANEL
Anion gap: 9 (ref 5–15)
BUN: 13 mg/dL (ref 6–20)
CO2: 25 mmol/L (ref 22–32)
Calcium: 9.2 mg/dL (ref 8.9–10.3)
Chloride: 107 mmol/L (ref 98–111)
Creatinine, Ser: 0.74 mg/dL (ref 0.44–1.00)
GFR calc Af Amer: >60 mL/min (ref >60)
GFR calc non Af Amer: >60 mL/min (ref >60)
Glucose, Bld: 93 mg/dL (ref 70–99)
Potassium: 4.1 mmol/L (ref 3.5–5.1)
Sodium: 141 mmol/L (ref 135–145)

## 2020-08-07 LAB — CBC
HCT: 41.1 % (ref 36.0–46.0)
Hemoglobin: 13.3 g/dL (ref 12.0–15.0)
MCH: 29.8 pg (ref 26.0–34.0)
MCHC: 32.4 g/dL (ref 30.0–36.0)
MCV: 91.9 fL (ref 80.0–100.0)
Platelets: 161 10*3/uL (ref 150–400)
RBC: 4.47 MIL/uL (ref 3.87–5.11)
RDW: 12.1 % (ref 11.5–15.5)
WBC: 6.4 10*3/uL (ref 4.0–10.5)
nRBC: 0 % (ref 0.0–0.2)

## 2020-08-07 LAB — SURGICAL PCR SCREEN
MRSA, PCR: NEGATIVE
Staphylococcus aureus: NEGATIVE

## 2020-08-07 LAB — SARS CORONAVIRUS 2 (TAT 6-24 HRS): SARS Coronavirus 2: NEGATIVE

## 2020-08-07 NOTE — Anesthesia Preprocedure Evaluation (Addendum)
Anesthesia Evaluation  Patient identified by MRN, date of birth, ID band Patient awake    Reviewed: Allergy & Precautions, NPO status , Patient's Chart, lab work & pertinent test results  Airway Mallampati: I  TM Distance: >3 FB Neck ROM: Full    Dental  (+) Teeth Intact, Dental Advisory Given   Pulmonary neg pulmonary ROS,    breath sounds clear to auscultation       Cardiovascular negative cardio ROS   Rhythm:Regular Rate:Normal     Neuro/Psych  Headaches, negative psych ROS   GI/Hepatic negative GI ROS, Neg liver ROS,   Endo/Other  negative endocrine ROS  Renal/GU negative Renal ROS     Musculoskeletal  (+) Arthritis ,   Abdominal Normal abdominal exam  (+)   Peds  Hematology negative hematology ROS (+)   Anesthesia Other Findings   Reproductive/Obstetrics                            Anesthesia Physical Anesthesia Plan  ASA: II  Anesthesia Plan: General   Post-op Pain Management:    Induction: Intravenous  PONV Risk Score and Plan: Ondansetron, Dexamethasone, Midazolam and Scopolamine patch - Pre-op  Airway Management Planned: Oral ETT  Additional Equipment: None  Intra-op Plan:   Post-operative Plan: Extubation in OR  Informed Consent: I have reviewed the patients History and Physical, chart, labs and discussed the procedure including the risks, benefits and alternatives for the proposed anesthesia with the patient or authorized representative who has indicated his/her understanding and acceptance.     Dental advisory given  Plan Discussed with: CRNA  Anesthesia Plan Comments: (Seen and cleared by PCP Dr. Wylene Simmer on 07/10/2020.  Per note, "no limitations, medically cleared, paperwork sent to Dr. Shon Baton for upcoming procedure."  Copy on chart.  Preop labs reviewed, WNL.  EKG 08/07/2020: NSR.  Rate 60.  Exercise stress test 07/23/2017:  Blood pressure demonstrated a  normal response to exercise.  There was no ST segment deviation noted during stress.   Indeterminate ECG stress test due to inadequate heart rate achieved. No evidence for ischemia at 79% of maximum predicted heart rate. )      Anesthesia Quick Evaluation

## 2020-08-07 NOTE — Pre-Procedure Instructions (Signed)
Theresa Ibarra  08/07/2020      [id number] - [place], [place] - [address] AT [facility] [address] [facility] [address] [place] [address] Phone: [phone] Fax: [phone]    Your procedure is scheduled on Wednesday, August 11.  Report to [facility], Main Entrance or Entrance "A" at 6:30 AM                    Your surgery or procedure is scheduled to begin at 8:30 A.M.   Call this number if you have problems the morning of surgery: [phone]  This is the number for the Pre- Surgical Desk.   Remember:  Do not eat or drink after midnight tonight.                Take these medicines the morning of surgery with A SIP OF WATER:  sertraline (ZOLOFT)  May use Flonase if needed.    STOP taking Aspirin, Aspirin Products (Goody Powder, Excedrin Migraine), Ibuprofen (Advil), Naproxen (Aleve), Vitamins and Herbal Products (ie Fish Oil).  The Morning of Surgery    Do not wear jewelry, make-up or nail polish.  Do not wear lotions, powders, or perfumes, or deodorant.  Do not shave 48 hours prior to surgery.               Do not bring valuables to the hospital.             [facility] is not responsible for any belongings or valuables.  Contacts, dentures or bridgework may not be worn into surgery.  Leave your suitcase in the car.  After surgery it may be brought to your room.  For patients admitted to the hospital, discharge time will be determined by your treatment team.  Patients discharged the day of surgery will not be allowed to drive home.    Special instructions:   [place]- Preparing For Surgery  Before surgery, you can play an important role. Because skin is not sterile, your skin needs to be as free of germs as possible. You can reduce the number of germs on your skin by washing with CHG (chlorahexidine gluconate) Soap before surgery.  CHG is an antiseptic cleaner which kills germs and bonds with the skin to  continue killing germs even after washing.    Oral Hygiene is also important to reduce your risk of infection.  Remember - BRUSH YOUR TEETH THE MORNING OF SURGERY WITH YOUR REGULAR TOOTHPASTE  Please do not use if you have an allergy to CHG or antibacterial soaps. If your skin becomes reddened/irritated stop using the CHG.  Do not shave (including legs and underarms) for at least 48 hours prior to first CHG shower. It is OK to shave your face.  Please follow these instructions carefully.   1. Shower the NIGHT BEFORE SURGERY and the MORNING OF SURGERY with CHG.   2. If you chose to wash your hair, wash your hair first as usual with your normal shampoo.  3. After you shampoo, rinse your hair and body thoroughly to remove the shampoo.  4. Use CHG as you would any other liquid soap. You can apply CHG directly to the skin and wash gently with a scrungie or a clean washcloth.   5. Apply the CHG Soap to your body ONLY FROM THE NECK DOWN.  Do not use on open wounds or open sores. Avoid contact with your eyes, ears, mouth and  genitals (private parts). Wash Face and genitals (private parts)  with your normal soap.  6. Wash thoroughly, paying special attention to the area where your surgery will be performed.  7. Thoroughly rinse your body with warm water from the neck down.  8. DO NOT shower/wash with your normal soap after using and rinsing off the CHG Soap.  9. Pat yourself dry with a CLEAN TOWEL.  10. Wear CLEAN PAJAMAS to bed the night before surgery, wear comfortable clothes the morning of surgery  11. Place CLEAN SHEETS on your bed the night of your first shower and DO NOT SLEEP WITH PETS.  Day of Surgery: Shower as instructed above. Do not apply any deodorants/lotions.  Please wear clean clothes to the hospital/surgery center.   Remember to brush your teeth WITH YOUR REGULAR TOOTHPASTE.  Please read over the fact sheets that you were given.

## 2020-08-07 NOTE — Progress Notes (Signed)
PCP - Dr. Guerry Bruin [facility]) Cardiologist - denies  PPM/ICD - denies  Chest x-ray - 08/07/2020 EKG - 08/07/2020 Stress Test - 07/23/2020 ECHO - denies Cardiac Cath - denies  Sleep Study - denies CPAP -   DM: denies  Blood Thinner Instructions: N/A Aspirin Instructions: N/A  ERAS Protcol - No  COVID TEST- Scheduled for today 08/07/2020. Patient verbalized understanding of self-quarantine instructions, appointment time and place.  Anesthesia review: YES, per MD order, records requested from PCP's office  Patient denies shortness of breath, fever, cough and chest pain at PAT appointment  All instructions explained to the patient, with a verbal understanding of the material. Patient agrees to go over the instructions while at home for a better understanding. Patient also instructed to self quarantine after being tested for COVID-19. The opportunity to ask questions was provided.

## 2020-08-08 ENCOUNTER — Ambulatory Visit (HOSPITAL_COMMUNITY): Payer: 59

## 2020-08-08 ENCOUNTER — Encounter (HOSPITAL_COMMUNITY): Payer: Self-pay | Admitting: Orthopedic Surgery

## 2020-08-08 ENCOUNTER — Ambulatory Visit (HOSPITAL_COMMUNITY)
Admission: RE | Disposition: A | Discharge status: Home / Self Care | Payer: Self-pay | Source: Home / Self Care | Attending: Orthopedic Surgery

## 2020-08-08 ENCOUNTER — Ambulatory Visit (HOSPITAL_COMMUNITY)
Admission: RE | Admit: 2020-08-08 | Discharge: 2020-08-08 | Disposition: A | Discharge status: Home / Self Care | Payer: 59 | Attending: Orthopedic Surgery | Admitting: Orthopedic Surgery

## 2020-08-08 DIAGNOSIS — M2578 Osteophyte, vertebrae: Secondary | ICD-10-CM | POA: Insufficient documentation

## 2020-08-08 DIAGNOSIS — Z888 Allergy status to other drugs, medicaments and biological substances status: Secondary | ICD-10-CM | POA: Diagnosis not present

## 2020-08-08 DIAGNOSIS — M1990 Unspecified osteoarthritis, unspecified site: Secondary | ICD-10-CM | POA: Insufficient documentation

## 2020-08-08 DIAGNOSIS — M5117 Intervertebral disc disorders with radiculopathy, lumbosacral region: Secondary | ICD-10-CM | POA: Insufficient documentation

## 2020-08-08 DIAGNOSIS — Z419 Encounter for procedure for purposes other than remedying health state, unspecified: Secondary | ICD-10-CM

## 2020-08-08 DIAGNOSIS — Z79899 Other long term (current) drug therapy: Secondary | ICD-10-CM | POA: Insufficient documentation

## 2020-08-08 DIAGNOSIS — Z8261 Family history of arthritis: Secondary | ICD-10-CM | POA: Insufficient documentation

## 2020-08-08 DIAGNOSIS — M4807 Spinal stenosis, lumbosacral region: Secondary | ICD-10-CM | POA: Diagnosis not present

## 2020-08-08 HISTORY — DX: Other complications of anesthesia, initial encounter: T88.59XA

## 2020-08-08 HISTORY — PX: LUMBAR LAMINECTOMY/DECOMPRESSION MICRODISCECTOMY: SHX5026

## 2020-08-08 SURGERY — LUMBAR LAMINECTOMY/DECOMPRESSION MICRODISCECTOMY 1 LEVEL
Anesthesia: General | Laterality: Left

## 2020-08-08 MED ORDER — OXYCODONE HCL 5 MG PO TABS: 5.0000 mg | ORAL_TABLET | Freq: Once | ORAL | Status: AC

## 2020-08-08 MED ORDER — PHENYLEPHRINE HCL (PRESSORS) 10 MG/ML IV SOLN
INTRAVENOUS | Status: DC | PRN
  Administered 2020-08-08: 80 ug via INTRAVENOUS

## 2020-08-08 MED ORDER — BUPIVACAINE LIPOSOME 1.3 % IJ SUSP
20.0000 mL | Freq: Once | INTRAMUSCULAR | Status: DC
  Filled 2020-08-08: qty 20

## 2020-08-08 MED ORDER — SODIUM CHLORIDE 0.9 % IV SOLN
INTRAVENOUS | Status: DC | PRN
  Administered 2020-08-08: 20 mL

## 2020-08-08 MED ORDER — MIDAZOLAM HCL 2 MG/2ML IJ SOLN
INTRAMUSCULAR | Status: AC
  Filled 2020-08-08: qty 2

## 2020-08-08 MED ORDER — ACETAMINOPHEN 10 MG/ML IV SOLN: 1000.0000 mg | Freq: Once | INTRAVENOUS | Status: DC | PRN

## 2020-08-08 MED ORDER — LIDOCAINE 2% (20 MG/ML) 5 ML SYRINGE
INTRAMUSCULAR | Status: DC | PRN
  Administered 2020-08-08: 60 mg via INTRAVENOUS

## 2020-08-08 MED ORDER — CEFAZOLIN SODIUM-DEXTROSE 2-4 GM/100ML-% IV SOLN
INTRAVENOUS | Status: AC
  Filled 2020-08-08: qty 100

## 2020-08-08 MED ORDER — METHYLPREDNISOLONE ACETATE 40 MG/ML IJ SUSP
INTRAMUSCULAR | Status: DC | PRN
Start: 2020-08-08 — End: 2020-08-08
  Administered 2020-08-08: 40 mg

## 2020-08-08 MED ORDER — ACETAMINOPHEN 160 MG/5ML PO SOLN: 325.0000 mg | Freq: Once | ORAL | Status: DC | PRN

## 2020-08-08 MED ORDER — PROPOFOL 10 MG/ML IV BOLUS
INTRAVENOUS | Status: AC
  Filled 2020-08-08: qty 40

## 2020-08-08 MED ORDER — CEFAZOLIN SODIUM-DEXTROSE 2-4 GM/100ML-% IV SOLN
2.0000 g | INTRAVENOUS | Status: AC
  Administered 2020-08-08: 2 g via INTRAVENOUS

## 2020-08-08 MED ORDER — OXYCODONE-ACETAMINOPHEN 10-325 MG PO TABS: 1.0000 | ORAL_TABLET | Freq: Four times a day (QID) | ORAL | 0 refills | Status: AC | PRN

## 2020-08-08 MED ORDER — CHLORHEXIDINE GLUCONATE 0.12 % MT SOLN
OROMUCOSAL | Status: AC
  Administered 2020-08-08: 15 mL via OROMUCOSAL
  Filled 2020-08-08: qty 15

## 2020-08-08 MED ORDER — CHLORHEXIDINE GLUCONATE 0.12 % MT SOLN: 15.0000 mL | Freq: Once | OROMUCOSAL | Status: AC

## 2020-08-08 MED ORDER — THROMBIN 20000 UNITS EX SOLR: CUTANEOUS | Status: DC | PRN

## 2020-08-08 MED ORDER — HYDROMORPHONE HCL 1 MG/ML IJ SOLN: 0.2500 mg | INTRAMUSCULAR | Status: DC | PRN

## 2020-08-08 MED ORDER — OXYCODONE-ACETAMINOPHEN 5-325 MG PO TABS
ORAL_TABLET | ORAL | Status: AC
  Administered 2020-08-08: 1 via ORAL
  Filled 2020-08-08: qty 1

## 2020-08-08 MED ORDER — ORAL CARE MOUTH RINSE: 15.0000 mL | Freq: Once | OROMUCOSAL | Status: AC

## 2020-08-08 MED ORDER — OXYCODONE HCL 5 MG PO TABS
ORAL_TABLET | ORAL | Status: AC
  Administered 2020-08-08: 5 mg via ORAL
  Filled 2020-08-08: qty 1

## 2020-08-08 MED ORDER — MEPERIDINE HCL 25 MG/ML IJ SOLN: 6.2500 mg | INTRAMUSCULAR | Status: DC | PRN

## 2020-08-08 MED ORDER — ONDANSETRON HCL 4 MG PO TABS: 4.0000 mg | ORAL_TABLET | Freq: Three times a day (TID) | ORAL | 0 refills | Status: DC | PRN

## 2020-08-08 MED ORDER — PHENYLEPHRINE HCL-NACL 10-0.9 MG/250ML-% IV SOLN
INTRAVENOUS | Status: DC | PRN
  Administered 2020-08-08: 20 ug/min via INTRAVENOUS

## 2020-08-08 MED ORDER — LACTATED RINGERS IV SOLN: INTRAVENOUS | Status: DC

## 2020-08-08 MED ORDER — OXYCODONE-ACETAMINOPHEN 5-325 MG PO TABS: 1.0000 | ORAL_TABLET | Freq: Four times a day (QID) | ORAL | Status: DC | PRN

## 2020-08-08 MED ORDER — BUPIVACAINE HCL (PF) 0.25 % IJ SOLN
INTRAMUSCULAR | Status: AC
  Filled 2020-08-08: qty 30

## 2020-08-08 MED ORDER — ACETAMINOPHEN 325 MG PO TABS: 325.0000 mg | ORAL_TABLET | Freq: Once | ORAL | Status: DC | PRN

## 2020-08-08 MED ORDER — EPINEPHRINE PF 1 MG/ML IJ SOLN
INTRAMUSCULAR | Status: DC | PRN
  Administered 2020-08-08: .15 mL

## 2020-08-08 MED ORDER — LIDOCAINE 2% (20 MG/ML) 5 ML SYRINGE
INTRAMUSCULAR | Status: AC
  Filled 2020-08-08: qty 5

## 2020-08-08 MED ORDER — EPINEPHRINE PF 1 MG/ML IJ SOLN
INTRAMUSCULAR | Status: AC
  Filled 2020-08-08: qty 1

## 2020-08-08 MED ORDER — BUPIVACAINE HCL (PF) 0.25 % IJ SOLN
INTRAMUSCULAR | Status: DC | PRN
  Administered 2020-08-08: 30 mL

## 2020-08-08 MED ORDER — PROPOFOL 10 MG/ML IV BOLUS
INTRAVENOUS | Status: DC | PRN
  Administered 2020-08-08: 120 mg via INTRAVENOUS

## 2020-08-08 MED ORDER — ACETAMINOPHEN 10 MG/ML IV SOLN
INTRAVENOUS | Status: AC
  Administered 2020-08-08: 1000 mg via INTRAVENOUS
  Filled 2020-08-08: qty 100

## 2020-08-08 MED ORDER — SCOPOLAMINE 1 MG/3DAYS TD PT72
MEDICATED_PATCH | TRANSDERMAL | Status: DC | PRN
  Administered 2020-08-08: 1 via TRANSDERMAL

## 2020-08-08 MED ORDER — ROCURONIUM BROMIDE 10 MG/ML (PF) SYRINGE
PREFILLED_SYRINGE | INTRAVENOUS | Status: AC
  Filled 2020-08-08: qty 10

## 2020-08-08 MED ORDER — 0.9 % SODIUM CHLORIDE (POUR BTL) OPTIME
TOPICAL | Status: DC | PRN
  Administered 2020-08-08: 1000 mL

## 2020-08-08 MED ORDER — METHOCARBAMOL 500 MG PO TABS: 500.0000 mg | ORAL_TABLET | Freq: Three times a day (TID) | ORAL | 0 refills | Status: AC | PRN

## 2020-08-08 MED ORDER — MIDAZOLAM HCL 5 MG/5ML IJ SOLN
INTRAMUSCULAR | Status: DC | PRN
  Administered 2020-08-08: 2 mg via INTRAVENOUS

## 2020-08-08 MED ORDER — ONDANSETRON HCL 4 MG/2ML IJ SOLN
INTRAMUSCULAR | Status: AC
  Filled 2020-08-08: qty 2

## 2020-08-08 MED ORDER — ROCURONIUM BROMIDE 10 MG/ML (PF) SYRINGE
PREFILLED_SYRINGE | INTRAVENOUS | Status: DC | PRN
  Administered 2020-08-08: 70 mg via INTRAVENOUS

## 2020-08-08 MED ORDER — ONDANSETRON HCL 4 MG/2ML IJ SOLN
INTRAMUSCULAR | Status: DC | PRN
  Administered 2020-08-08: 4 mg via INTRAVENOUS

## 2020-08-08 MED ORDER — SUGAMMADEX SODIUM 200 MG/2ML IV SOLN
INTRAVENOUS | Status: DC | PRN
  Administered 2020-08-08: 200 mg via INTRAVENOUS

## 2020-08-08 MED ORDER — AMISULPRIDE (ANTIEMETIC) 5 MG/2ML IV SOLN: 10.0000 mg | Freq: Once | INTRAVENOUS | Status: DC | PRN

## 2020-08-08 MED ORDER — METHYLPREDNISOLONE ACETATE 40 MG/ML IJ SUSP
INTRAMUSCULAR | Status: AC
  Filled 2020-08-08: qty 1

## 2020-08-08 MED ORDER — THROMBIN 20000 UNITS EX KIT
PACK | CUTANEOUS | Status: AC
  Filled 2020-08-08: qty 1

## 2020-08-08 MED ORDER — DEXAMETHASONE SODIUM PHOSPHATE 10 MG/ML IJ SOLN
INTRAMUSCULAR | Status: DC | PRN
Start: 2020-08-08 — End: 2020-08-08
  Administered 2020-08-08: 10 mg via INTRAVENOUS

## 2020-08-08 MED ORDER — FENTANYL CITRATE (PF) 100 MCG/2ML IJ SOLN
INTRAMUSCULAR | Status: DC | PRN
  Administered 2020-08-08: 100 ug via INTRAVENOUS
  Administered 2020-08-08: 50 ug via INTRAVENOUS

## 2020-08-08 MED ORDER — DEXAMETHASONE SODIUM PHOSPHATE 10 MG/ML IJ SOLN
INTRAMUSCULAR | Status: AC
  Filled 2020-08-08: qty 1

## 2020-08-08 MED ORDER — SCOPOLAMINE 1 MG/3DAYS TD PT72
MEDICATED_PATCH | TRANSDERMAL | Status: AC
  Filled 2020-08-08: qty 1

## 2020-08-08 MED ORDER — FENTANYL CITRATE (PF) 250 MCG/5ML IJ SOLN
INTRAMUSCULAR | Status: AC
  Filled 2020-08-08: qty 5

## 2020-08-08 SURGICAL SUPPLY — 59 items
AGENT HMST KT MTR STRL THRMB (HEMOSTASIS)
BNDG GAUZE ELAST 4 BULKY (GAUZE/BANDAGES/DRESSINGS) ×2 IMPLANT
BUR EGG ELITE 4.0 (BURR) IMPLANT
BUR MATCHSTICK NEURO 3.0 LAGG (BURR) IMPLANT
CANISTER SUCT 3000ML PPV (MISCELLANEOUS) ×2 IMPLANT
CLSR STERI-STRIP ANTIMIC 1/2X4 (GAUZE/BANDAGES/DRESSINGS) ×2 IMPLANT
CORD BIPOLAR FORCEPS 12FT (ELECTRODE) ×2 IMPLANT
COVER SURGICAL LIGHT HANDLE (MISCELLANEOUS) ×2 IMPLANT
COVER WAND RF STERILE (DRAPES) ×2 IMPLANT
DRAIN CHANNEL 15F RND FF W/TCR (WOUND CARE) IMPLANT
DRAPE POUCH INSTRU U-SHP 10X18 (DRAPES) ×2 IMPLANT
DRAPE SURG 17X23 STRL (DRAPES) ×2 IMPLANT
DRAPE U-SHAPE 47X51 STRL (DRAPES) ×2 IMPLANT
DRSG OPSITE POSTOP 3X4 (GAUZE/BANDAGES/DRESSINGS) ×2 IMPLANT
DRSG OPSITE POSTOP 4X8 (GAUZE/BANDAGES/DRESSINGS) ×1 IMPLANT
DURAPREP 26ML APPLICATOR (WOUND CARE) ×2 IMPLANT
ELECT BLADE 4.0 EZ CLEAN MEGAD (MISCELLANEOUS)
ELECT CAUTERY BLADE 6.4 (BLADE) ×2 IMPLANT
ELECT PENCIL ROCKER SW 15FT (MISCELLANEOUS) ×2 IMPLANT
ELECT REM PT RETURN 9FT ADLT (ELECTROSURGICAL) ×2
ELECTRODE BLDE 4.0 EZ CLN MEGD (MISCELLANEOUS) IMPLANT
ELECTRODE REM PT RTRN 9FT ADLT (ELECTROSURGICAL) ×1 IMPLANT
EVACUATOR SILICONE 100CC (DRAIN) IMPLANT
GLOVE BIO SURGEON STRL SZ 6.5 (GLOVE) ×2 IMPLANT
GLOVE BIOGEL PI IND STRL 6.5 (GLOVE) ×1 IMPLANT
GLOVE BIOGEL PI IND STRL 8.5 (GLOVE) ×1 IMPLANT
GLOVE BIOGEL PI INDICATOR 6.5 (GLOVE) ×1
GLOVE BIOGEL PI INDICATOR 8.5 (GLOVE) ×1
GLOVE SS BIOGEL STRL SZ 8.5 (GLOVE) ×1 IMPLANT
GLOVE SUPERSENSE BIOGEL SZ 8.5 (GLOVE) ×1
GOWN STRL REUS W/ TWL LRG LVL3 (GOWN DISPOSABLE) ×2 IMPLANT
GOWN STRL REUS W/TWL 2XL LVL3 (GOWN DISPOSABLE) ×2 IMPLANT
GOWN STRL REUS W/TWL LRG LVL3 (GOWN DISPOSABLE) ×4
KIT BASIN OR (CUSTOM PROCEDURE TRAY) ×2 IMPLANT
KIT TURNOVER KIT B (KITS) ×2 IMPLANT
NDL SPNL 18GX3.5 QUINCKE PK (NEEDLE) ×2 IMPLANT
NEEDLE 22X1 1/2 (OR ONLY) (NEEDLE) ×2 IMPLANT
NEEDLE SPNL 18GX3.5 QUINCKE PK (NEEDLE) ×4 IMPLANT
NS IRRIG 1000ML POUR BTL (IV SOLUTION) ×2 IMPLANT
PACK LAMINECTOMY ORTHO (CUSTOM PROCEDURE TRAY) ×2 IMPLANT
PACK UNIVERSAL I (CUSTOM PROCEDURE TRAY) ×2 IMPLANT
PAD ARMBOARD 7.5X6 YLW CONV (MISCELLANEOUS) ×4 IMPLANT
PATTIES SURGICAL .5 X.5 (GAUZE/BANDAGES/DRESSINGS) ×2 IMPLANT
PATTIES SURGICAL .5 X1 (DISPOSABLE) ×2 IMPLANT
SPONGE SURGIFOAM ABS GEL 100 (HEMOSTASIS) IMPLANT
SURGIFLO W/THROMBIN 8M KIT (HEMOSTASIS) IMPLANT
SUT BONE WAX W31G (SUTURE) ×2 IMPLANT
SUT MNCRL AB 3-0 PS2 27 (SUTURE) ×2 IMPLANT
SUT VIC AB 0 CT1 27 (SUTURE)
SUT VIC AB 0 CT1 27XBRD ANBCTR (SUTURE) IMPLANT
SUT VIC AB 1 CT1 18XCR BRD 8 (SUTURE) ×1 IMPLANT
SUT VIC AB 1 CT1 8-18 (SUTURE) ×2
SUT VIC AB 2-0 CT1 18 (SUTURE) ×2 IMPLANT
SYR BULB IRRIG 60ML STRL (SYRINGE) ×2 IMPLANT
SYR CONTROL 10ML LL (SYRINGE) ×2 IMPLANT
TOWEL GREEN STERILE (TOWEL DISPOSABLE) ×2 IMPLANT
TOWEL GREEN STERILE FF (TOWEL DISPOSABLE) ×2 IMPLANT
WATER STERILE IRR 1000ML POUR (IV SOLUTION) ×2 IMPLANT
YANKAUER SUCT BULB TIP NO VENT (SUCTIONS) IMPLANT

## 2020-08-08 NOTE — Op Note (Signed)
Operative report  Preoperative diagnosis: L5-S1 left-sided disc herniation with S1 radiculopathy  Postoperative diagnosis: Same  Operative procedure: Left L5-S1 discectomy  CPT code: 62947  Complications: None  First Assistant: Glynis Smiles, PA  EBL: 30cc  Indications: This is a very pleasant 54 year old woman who is having significant back buttock and radicular left leg pain for some time now.  Despite appropriate conservative management consisting of physical and injection therapy as well as medications her quality of life is continued to deteriorate.  As result she elected to move forward with surgery.  Imaging studies confirmed a left posterior lateral disc herniation at L5-S1 causing compression of the traversing S1 nerve root.  Operative report: Patient was brought the operating room placed upon the operating room table.  After successful induction of general anesthesia endotracheal intubation teds SCDs were applied and she was turned prone onto the Wilson frame.  All bony prominences well-padded and the back was prepped and draped in a standard fashion.  Timeout was taken to confirm patient procedure and all other important data.  2 needles were placed into the back and an intraoperative x-ray was taken for localization of the skin incision.  I marked out the incision and infiltrated with quarter percent Marcaine with epinephrine.  Midline incision was made and sharp dissection was carried out down to the deep fascia.  I then infiltrated the deep fascia with a combination of quarter percent Marcaine with epinephrine as well as Exparel.  I then incised the deep fascia and stripped the paraspinal muscles to expose the L5 and S1 lamina and facet complex.  A Penfield 4 was placed into the L5 lamina and a second x-ray was taken confirming that I was at the appropriate level.  Taylor retractor was placed on the lateral side of the facet complex and had excellent exposure of the posterior lateral  aspect of the spine.  Laminotomy was performed with a 3 mm Kerrison rongeur and the bony edges were sealed with bone wax.  I then gently dissected through the ligamentum flavum with a Penfield 4 until I developed my plane between the thecal sac and the ligamentum flavum.  I remove the ligamentum flavum with a 3 mm Kerrison rongeur and then dissected using my Penfield 4 into the lateral recess.  Using a 2 mm Kerrison rongeur I performed a medial facetectomy and I could now visualize the S1 nerve root.  It was under tension and was not freely mobile.  I gently began retracting it and mobilizing it using neuro patties and a Penfield 4.  The disc space came into view and I could visualize the disc herniation.  This was consistent with what was seen on the preoperative MRI.  Annulotomy was performed with a 15 blade scalpel and then using nerve hooks and pituitary rongeurs I removed multiple fragments of disc material.  Collectively it was consistent with what was seen on the preoperative MRI.  I then used my Epstein curette to remove any of the osteophytes that had formed.  At this point I irrigated the wound copiously normal saline and I was able to easily mobilize the S1 nerve root.  I could freely pass my nerve hook into the S1 foramen medial along the annulus and superiorly into the lateral recess.  At this point I felt as though the nerve root was completely decompressed and I had remove the fragment of disc material causing the neural compression.  Using bipolar cautery and FloSeal I obtained hemostasis.  The wound  was copiously irrigated at this point with normal saline and I placed approximately 20 mg (1/2 cc) of Depo-Medrol over the nerve for improved postoperative analgesia.  Thrombin-soaked Gelfoam patty was placed over the laminotomy defect and I removed the retractor.  The deep fascia was closed in a layered fashion with interrupted #1 Vicryl suture, then 2-0 Vicryl suture, and 3-0 Monocryl for the  skin.  Steri-Strips and a dry dressing were applied and the patient was ultimately extubated transfer the PACU without incident.  The end of the case all needle sponge counts were correct.  There were no adverse intraoperative events.

## 2020-08-08 NOTE — Anesthesia Procedure Notes (Signed)
Procedure Name: Intubation Date/Time: 08/08/2020 8:52 AM Performed by: Moshe Salisbury, CRNA Pre-anesthesia Checklist: Patient identified, Emergency Drugs available, Suction available and Patient being monitored Patient Re-evaluated:Patient Re-evaluated prior to induction Oxygen Delivery Method: Circle System Utilized Preoxygenation: Pre-oxygenation with 100% oxygen Induction Type: IV induction Ventilation: Oral airway inserted - appropriate to patient size Laryngoscope Size: Mac and 3 Grade View: Grade I Tube type: Oral Tube size: 7.0 mm Number of attempts: 1 Airway Equipment and Method: Stylet and Oral airway Placement Confirmation: ETT inserted through vocal cords under direct vision,  positive ETCO2 and breath sounds checked- equal and bilateral Secured at: 22 cm Tube secured with: Tape Dental Injury: Teeth and Oropharynx as per pre-operative assessment  Comments: AOI by Pierce Crane., SRNA under direct superivision.

## 2020-08-08 NOTE — H&P (Addendum)
Addendum H&P  Patient continues to have significant radicular left leg pain.  Imaging studies confirm L5-S1 disc herniation with S1 nerve compression.  As result of the failure of conservative management we have elected to move forward with a lumbar microdiscectomy.  I have gone over the risks and benefits with her and all of her questions were encouraged and addressed.  There has been no change in her clinical exam since her last office visit of 07/27/2020.

## 2020-08-08 NOTE — Brief Op Note (Signed)
08/08/2020  10:17 AM  PATIENT:  Theresa Ibarra  54 y.o. female  PRE-OPERATIVE DIAGNOSIS:  Lumbar radiculopathy, lateral recess stenosis L5-S1  POST-OPERATIVE DIAGNOSIS:  Lumbar radiculopathy, lateral recess stenosis L5-S1  PROCEDURE:  Procedure(s) with comments: L5-S1 lumbar discectomy left side (Left) - 2.5 hrs  SURGEON:  Surgeon(s) and Role:    Venita Lick, MD - Primary  PHYSICIAN ASSISTANT:   ASSISTANTS: Amanda Ward, PA   ANESTHESIA:   general  EBL:  30 mL   BLOOD ADMINISTERED:none  DRAINS: none   LOCAL MEDICATIONS USED:  MARCAINE    and OTHER exparel, depomedrol  SPECIMEN:  No Specimen  DISPOSITION OF SPECIMEN:  N/A  COUNTS:  YES  TOURNIQUET:  * No tourniquets in log *  DICTATION: .Dragon Dictation  PLAN OF CARE: Discharge to home after PACU  PATIENT DISPOSITION:  PACU - hemodynamically stable.

## 2020-08-08 NOTE — Anesthesia Postprocedure Evaluation (Signed)
Anesthesia Post Note  Patient: Theresa Ibarra  Procedure(s) Performed: L5-S1 lumbar discectomy left side (Left )     Patient location during evaluation: PACU Anesthesia Type: General Level of consciousness: awake and alert Pain management: pain level controlled Vital Signs Assessment: post-procedure vital signs reviewed and stable Respiratory status: spontaneous breathing, nonlabored ventilation, respiratory function stable and patient connected to nasal cannula oxygen Cardiovascular status: blood pressure returned to baseline and stable Postop Assessment: no apparent nausea or vomiting Anesthetic complications: no   No complications documented.  Last Vitals:  Vitals:   08/08/20 1140 08/08/20 1155  BP: 132/72 (!) 146/72  Pulse: 65 70  Resp: 17 17  Temp:  36.6 C  SpO2: 96% 97%    Last Pain:  Vitals:   08/08/20 1155  TempSrc:   PainSc: 0-No pain                 Shelton Silvas

## 2020-08-08 NOTE — Transfer of Care (Signed)
Immediate Anesthesia Transfer of Care Note  Patient: Theresa Ibarra  Procedure(s) Performed: L5-S1 lumbar discectomy left side (Left )  Patient Location: PACU  Anesthesia Type:General  Level of Consciousness: drowsy and patient cooperative  Airway & Oxygen Therapy: Patient Spontanous Breathing and Patient connected to face mask oxygen  Post-op Assessment: Report given to RN, Post -op Vital signs reviewed and stable and Patient moving all extremities  Post vital signs: Reviewed and stable  Last Vitals:  Vitals Value Taken Time  BP 164/82 08/08/20 1025  Temp    Pulse 66 08/08/20 1027  Resp 12 08/08/20 1027  SpO2 100 % 08/08/20 1027  Vitals shown include unvalidated device data.  Last Pain:  Vitals:   08/08/20 0707  TempSrc: Oral  PainSc: 0-No pain      Patients Stated Pain Goal: 3 (08/08/20 0707)  Complications: No complications documented.

## 2020-08-08 NOTE — Discharge Instructions (Signed)
  Surgical Spinal Decompression, Care After Refer to this sheet in the next few weeks. These instructions provide you with information about caring for yourself after your procedure. Your health care provider may also give you more specific instructions. Your treatment has been planned according to current medical practices, but problems sometimes occur. Call your health care provider if you have any problems or questions after your procedure. What can I expect after the procedure? It is common to have pain for the first few days after the procedure. Some people continue to have mild pain even after making a full recovery. Follow these instructions at home: Medicine Take medicines only as directed by your health care provider. Avoid taking over-the-counter pain medicines unless your health care provider tells you otherwise. These medicines interfere with the development and growth of new bone cells. If you were prescribed a narcotic pain medicine, take it exactly as told by your health care provider. Do not drink alcohol while on the medicine. Do not drive while on the medicine. Injury care Care for your back brace as told by your health care provider. Brace should be worn at all times for the first 6 weeks except when sleeping, bathing, and sitting leisurely around the home. You CANNOT drive while wearing the brace, however, you may wear it as a passenger.  If directed, apply ice to the injured area: Put ice in a plastic bag. Place a towel between your skin and the bag. Leave the ice on for 20 minutes, 2-3 times a day. Activity Perform physical therapy exercises as told by your health care provider. Exercise regularly. Start by taking short walks. Slowly increase your activity level over time. Gentle exercise helps to ease pain. Sit, stand, walk, turn in bed, and reposition yourself as told by your health care provider. This will help to keep your spine in proper alignment. Avoid bending and  twisting your body. Avoid doing strenuous household chores, such as vacuuming. Do not lift anything that is heavier than 10 lb (4.5 kg). Other Instructions Keep all follow-up visits as directed by your health care provider. This is important. Do not use any tobacco products, including cigarettes, chewing tobacco, or electronic cigarettes. If you need help quitting, ask your health care provider. Nicotine affects the way bones heal. Contact a health care provider if: Your pain gets worse. You have a fever. You have redness, swelling, or pain at the site of your incision. You have fluid, blood, or pus coming from your incision. You have numbness, tingling, or weakness in any part of your body. Get help right away if: Your incision feels swollen and tender, and the surrounding area looks like a lump. The lump may be red or bluish in color. You cannot move any part of your body (paralysis). You cannot control your bladder or bowels. This information is not intended to replace advice given to you by your health care provider. Make sure you discuss any questions you have with your health care provider. 

## 2020-08-09 ENCOUNTER — Encounter (HOSPITAL_COMMUNITY): Payer: Self-pay | Admitting: Orthopedic Surgery

## 2020-08-09 MED FILL — Thrombin For Soln Kit 20000 Unit: CUTANEOUS | Qty: 1 | Status: AC

## 2021-03-03 LAB — COLOGUARD: COLOGUARD: NEGATIVE

## 2021-03-03 LAB — EXTERNAL GENERIC LAB PROCEDURE: COLOGUARD: NEGATIVE

## 2021-11-28 ENCOUNTER — Other Ambulatory Visit: Payer: Self-pay | Admitting: Internal Medicine

## 2021-11-28 DIAGNOSIS — E785 Hyperlipidemia, unspecified: Secondary | ICD-10-CM

## 2022-01-06 ENCOUNTER — Ambulatory Visit
Admission: RE | Admit: 2022-01-06 | Discharge: 2022-01-06 | Disposition: A | Discharge status: Home / Self Care | Payer: No Typology Code available for payment source | Source: Ambulatory Visit | Attending: Internal Medicine | Admitting: Internal Medicine

## 2022-01-06 DIAGNOSIS — E785 Hyperlipidemia, unspecified: Secondary | ICD-10-CM

## 2022-01-29 ENCOUNTER — Encounter: Payer: Self-pay | Admitting: *Deleted

## 2022-01-30 ENCOUNTER — Ambulatory Visit: Payer: PRIVATE HEALTH INSURANCE | Admitting: Neurology

## 2022-01-30 ENCOUNTER — Encounter: Payer: Self-pay | Admitting: Neurology

## 2022-01-30 VITALS — BP 127/70 | HR 73 | Ht 68.0 in | Wt 165.2 lb

## 2022-01-30 DIAGNOSIS — R0683 Snoring: Secondary | ICD-10-CM

## 2022-01-30 DIAGNOSIS — R351 Nocturia: Secondary | ICD-10-CM

## 2022-01-30 DIAGNOSIS — G4719 Other hypersomnia: Secondary | ICD-10-CM | POA: Diagnosis not present

## 2022-01-30 DIAGNOSIS — E663 Overweight: Secondary | ICD-10-CM | POA: Diagnosis not present

## 2022-01-30 DIAGNOSIS — R519 Headache, unspecified: Secondary | ICD-10-CM

## 2022-01-30 NOTE — Patient Instructions (Signed)

## 2022-01-30 NOTE — Progress Notes (Signed)
Subjective:    Patient ID: Theresa HallsLinda M Ibarra is a 56 y.o. female.  HPI    History:   Dear Dr. Wylene Simmerisovec,   I saw your patient, Theresa RhineLinda Ibarra, upon your kind request in my sleep clinic today for initial consultation of her sleep disorder, in particular, concern for underlying obstructive sleep apnea.  Information is unaccompanied today.  As you know, Theresa Ibarra is a 56 year old right-handed woman with an underlying medical history of erythematous, tinnitus, anxiety, allergies, hypertension, degenerative lumbar spine disease, status post surgery under Dr. Shon BatonBrooks in August 2021, and borderline overweight state, who reports snoring and excessive daytime somnolence.  I reviewed your office note from 10/21/2021.  She had blood work through your office on 10/14/2021 and I reviewed the results: CBC showed benign findings.  Lipid panel showed total cholesterol of 192, triglycerides 97, HDL 48, LDL 125.  CMP showed glucose of 97, creatinine 0.8, BUN 9.  TSH on 10/21/2021 was normal at 2.49.  Her Epworth sleepiness score is 1/24, fatigue severity score is 25 out of 63.  She does not always wake up rested.  She denies any nocturnal cough or reflux symptoms, denies symptoms of restless leg syndrome or leg twitching at night.  She works as a Interior and spatial designerhairdresser.  She is married and lives with her husband, they have 2 grown sons and 4 grandsons.  They have 1 dog in the household.  She does have a TV in her bedroom but not on late at night.  She likes to read before falling asleep.  Bedtime is generally between 11 and 11:30 PM and rise time around 7 AM, she typically does not have to set an alarm, typically work starts at 10 AM.  Her husband is a Engineer, watervolunteer firefighter so sleep is interrupted by his alarm sometimes but sometimes she does not seem to even hear it.  She is a non-smoker and drinks alcohol occasionally.  She drinks 1 large cup of coffee in the morning.  Her husband is on a CPAP machine but has residual snoring.  She is a  restless sleeper per husband's feedback, she seems to toss and turn a lot.  She has woken up occasionally with a headache in the back of her head, seems dull and achy, sometimes she takes ibuprofen for this.  In the past, she had migraines but they improved after she went through menopause.  She does endorse stress, she lost her father-in-law in December and mother-in-law is needing more help, is in in-home dialysis. Patient uses a white noise machine in her bedroom and an air purifier.  She has nocturia about once per average night.  Her Past Medical History Is Significant For: Past Medical History:  Diagnosis Date   Anemia    Anxiety    Arthritis    Change in vision    Complication of anesthesia    38 yrs ago after a c-section she was hard to wake up and had high blood pressure.   Dizziness    Family history of breast cancer    Family history of colon cancer    HTN (hypertension)    Low back pain    Migraines    Palpitations    SOB (shortness of breath)    Tinnitus     Her Past Surgical History Is Significant For: Past Surgical History:  Procedure Laterality Date   CESAREAN SECTION  1986   CESAREAN SECTION  1987   LUMBAR LAMINECTOMY/DECOMPRESSION MICRODISCECTOMY Left 08/08/2020   Procedure: L5-S1 lumbar  discectomy left side;  Surgeon: Venita LickBrooks, Dahari, MD;  [facility];  Service: Orthopedics;  Laterality: Left;  2.5 hrs   TUBAL LIGATION     WISDOM TOOTH EXTRACTION      Her Family History Is Significant For: Family History  Problem Relation Age of Onset   Osteoarthritis Mother    Other Mother [id number]       tractor accident   Osteoarthritis Father    COPD Father    Diabetes Sister    COPD Sister    Osteoarthritis Sister    Breast cancer Sister [id number]       intraductal   Depression Sister    Mental illness Sister    CVA Sister    Stroke Sister [phone]   Hyperlipidemia Brother    Hypertension Brother    Heart disease Brother    Osteoarthritis Brother    CAD Brother    Heart  attack Brother [id number]   Depression Brother    Alzheimer's disease Maternal Aunt    Other Maternal Uncle        infant death   Breast cancer Paternal Aunt    Colon cancer Paternal Uncle    Breast cancer Paternal Grandmother    Diabetes Other    Heart disease Other    Asthma Other    Arthritis Other    Hypertension Other    Cancer Other    Migraines Other    Sleep apnea Other     Her Social History Is Significant For: Social History   Socioeconomic History   Marital status: Married    Spouse name: Not on file   Number of children: Not on file   Years of education: Not on file   Highest education level: Not on file  Occupational History   Not on file  Tobacco Use   Smoking status: Never   Smokeless tobacco: Never  Vaping Use   Vaping Use: Never used  Substance and Sexual Activity   Alcohol use: Yes    Comment: occasional   Drug use: No   Sexual activity: Yes    Comment: MARRIED  Other Topics Concern   Not on file  Social History Narrative   Not on file   Social Determinants of Health   Financial Resource Strain: Not on file  Food Insecurity: Not on file  Transportation Needs: Not on file  Physical Activity: Not on file  Stress: Not on file  Social Connections: Not on file    Her Allergies Are:  Allergies  Allergen Reactions   Gabapentin Nausea And Vomiting  :   Her Current Medications Are:  Outpatient Encounter Medications as of 01/30/2022  Medication Sig   fluticasone (FLONASE) 50 MCG/ACT nasal spray Place 1 spray into both nostrils daily as needed for allergies.   ondansetron (ZOFRAN) 4 MG tablet Take 1 tablet (4 mg total) by mouth every 8 (eight) hours as needed for nausea or vomiting.   sertraline (ZOLOFT) 50 MG tablet Take 50 mg by mouth daily.   No facility-administered encounter medications on file as of 01/30/2022.  :  Review of Systems:  Out of a complete 14 point review of systems, all are reviewed and negative with the exception of these  symptoms as listed below:  Review of Systems  Neurological:        Pt is here for Sleep Consult. Pt states she does snore a little ,at times headaches in am. Pt denies fatigue,sleep study,and CPAP machine at home, hypertension   ESS:1  FSS :25   Objective:  Neurological Exam  Physical Exam Physical Examination:   Vitals:   01/30/22 1031  BP: 127/70  Pulse: 73    General Examination: The patient is a very pleasant 56 y.o. female in no acute distress. She appears well-developed and well-nourished and well groomed.   HEENT: Normocephalic, atraumatic, pupils are equal, round and reactive to light, extraocular tracking is good without limitation to gaze excursion or nystagmus noted. Hearing is grossly intact. Face is symmetric with normal facial animation. Speech is clear with no dysarthria noted. There is no hypophonia. There is no lip, neck/head, jaw or voice tremor. Neck is supple with full range of passive and active motion. There are no carotid bruits on auscultation. Oropharynx exam reveals: mild mouth dryness, adequate dental hygiene and mild airway crowding, due to small airway entry, tonsils on the smaller side, Mallampati class II.  Neck circumference of 14 7/8 inches.  Mild overbite. Tongue protrudes centrally and palate elevates symmetrically.    Chest: Clear to auscultation without wheezing, rhonchi or crackles noted.  Heart: S1+S2+0, regular and normal without murmurs, rubs or gallops noted.   Abdomen: Soft, non-tender and non-distended.  Extremities: There is no pitting edema in the distal lower extremities bilaterally.   Skin: Warm and dry without trophic changes noted.   Musculoskeletal: exam reveals no obvious joint deformities.   Neurologically:  Mental status: The patient is awake, alert and oriented in all 4 spheres. Her immediate and remote memory, attention, language skills and fund of knowledge are appropriate. There is no evidence of aphasia, agnosia, apraxia  or anomia. Speech is clear with normal prosody and enunciation. Thought process is linear. Mood is normal and affect is normal.  Cranial nerves II - XII are as described above under HEENT exam.  Motor exam: Normal bulk, strength and tone is noted. There is no tremor. Fine motor skills and coordination: grossly intact.  Cerebellar testing: No dysmetria or intention tremor. There is no truncal or gait ataxia.  Sensory exam: intact to light touch in the upper and lower extremities.  Gait, station and balance: She stands easily. No veering to one side is noted. No leaning to one side is noted. Posture is age-appropriate and stance is narrow based. Gait shows normal stride length and normal pace. No problems turning are noted.   Assessment and Plan:   In summary, Theresa Ibarra is a very pleasant 56 y.o.-year old female with an underlying medical history of erythematous, tinnitus, anxiety, allergies, hypertension, degenerative lumbar spine disease, status post surgery under Dr. Shon Baton in August 2021, and borderline overweight state, whose history and physical exam are concerning for obstructive sleep apnea (OSA). I had a long chat with the patient about my findings and the diagnosis of OSA, its prognosis and treatment options. We talked about medical treatments, surgical interventions and non-pharmacological approaches. I explained in particular the risks and ramifications of untreated moderate to severe OSA, especially with respect to developing cardiovascular disease down the Road, including congestive heart failure, difficult to treat hypertension, cardiac arrhythmias, or stroke. Even type 2 diabetes has, in part, been linked to untreated OSA. Symptoms of untreated OSA include daytime sleepiness, memory problems, mood irritability and mood disorder such as depression and anxiety, lack of energy, as well as recurrent headaches, especially morning headaches. We talked about trying to maintain a  healthy  lifestyle in general, as well as the importance of weight control. We also talked about the importance of good sleep hygiene. I  recommended the following at this time: sleep study.  I outlined the differences between a laboratory attended sleep study versus home sleep test. I explained the sleep test procedure to the patient and also outlined possible surgical and non-surgical treatment options of OSA, including the use of a custom-made dental device (which would require a referral to a specialist dentist or oral surgeon), upper airway surgical options, such as traditional UPPP or a novel less invasive surgical option in the form of Inspire hypoglossal nerve stimulation (which would involve a referral to an ENT surgeon). I also explained the CPAP treatment option to the patient, who indicated that she would be willing to try CPAP if the need arises. I explained the importance of being compliant with PAP treatment, not only for insurance purposes but primarily to improve Her symptoms, and for the patient's long term health benefit, including to reduce Her cardiovascular risks. I answered all her questions today and the patient was in agreement. I plan to see her back after the sleep study is completed and encouraged her to call with any interim questions, concerns, problems or updates.   Thank you very much for allowing me to participate in the care of this nice patient. If I can be of any further assistance to you please do not hesitate to call me at [phone].  Sincerely,   Huston Foley, MD, PhD

## 2022-02-05 IMAGING — CT CT CARDIAC CORONARY ARTERY CALCIUM SCORE
3 series · 12 of 20 positions shown, 14 images · non-contrast
Comparison: No cross-sectional imaging of the chest for comparison.

CLINICAL DATA: A 55-year-old Caucasian Siane Rasun and a much of
stupor question for a a I in ID the that do the calcium scoring and
occasionally will get these at calcium female presents for
evaluation of coronary artery calcium in the setting of
hyperlipidemia.

EXAM:
CT CARDIAC CORONARY ARTERY CALCIUM SCORE
TECHNIQUE: Non-contrast imaging through the heart was performed using
prospective ECG gating. Image post processing was performed on an
independent workstation, allowing for quantitative analysis of the
heart and coronary arteries. Note that this exam targets the heart
and the chest was not imaged in its entirety.
Radiation dose reduction: This exam was performed according to the
departmental dose-optimization program which includes automated
exposure control, adjustment of the mA and/or kV according to
patient size and/or use of iterative reconstruction technique.

[Series 2: calcium scoring 2.00 qr36 bestdiast 69% hrt calciu · axial · 0.40mm/px · z∈[+1762,+1794]mm · 2 of 80 slices shown]
[im 16/80  vessel]
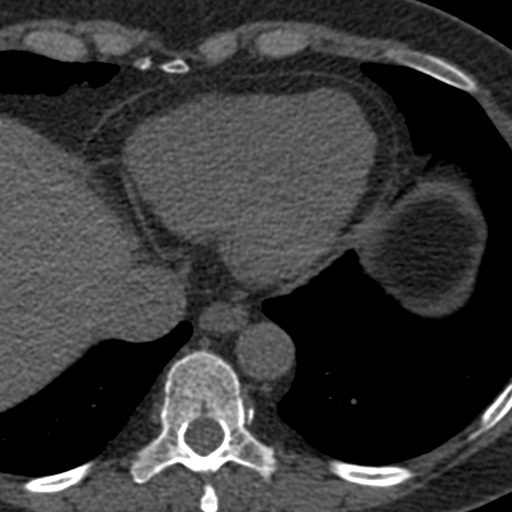
[im 32/80  vessel]
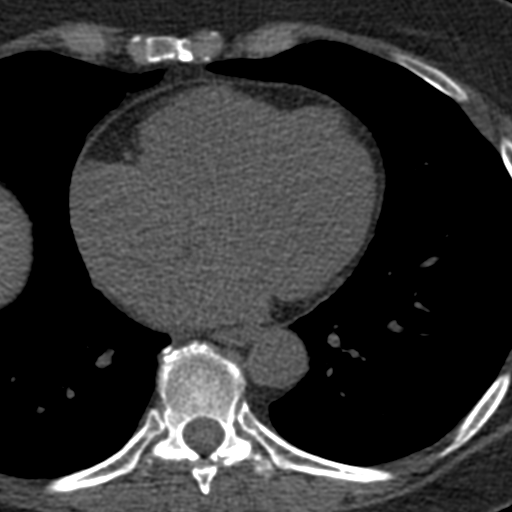

[Series 3: calcium scoring 2.00 br40 bestdiast 69% axial · axial · 0.53mm/px · z∈[+1758,+1862]mm · 5 of 80 slices shown, 7 images]
[im 14/80  vessel]
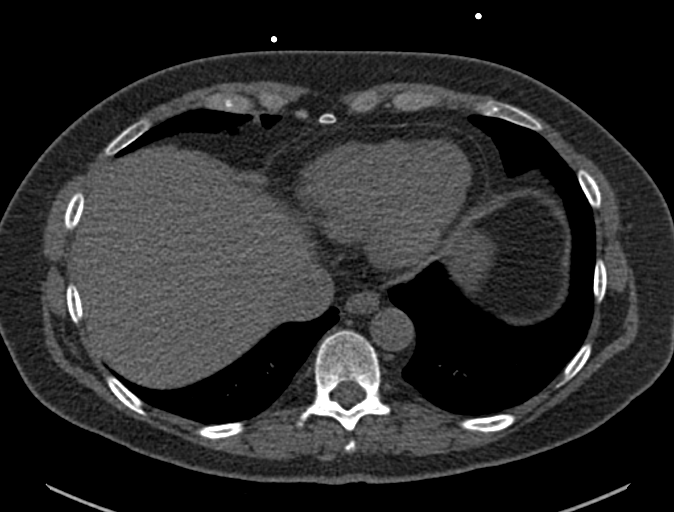
[im 14/80  lung]
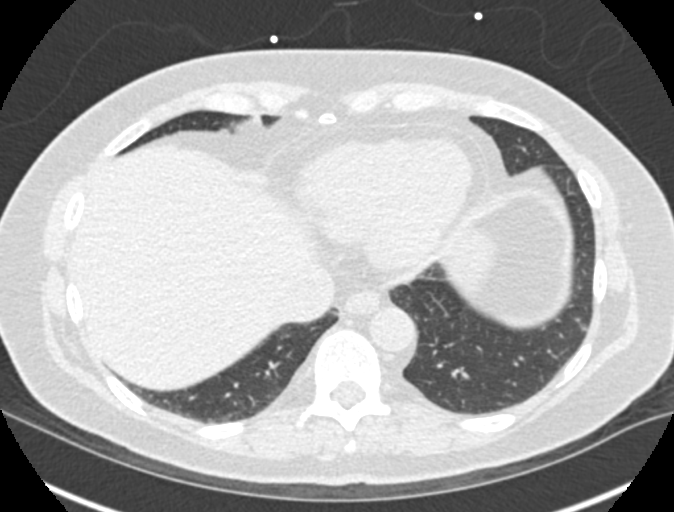
[im 27/80  vessel]
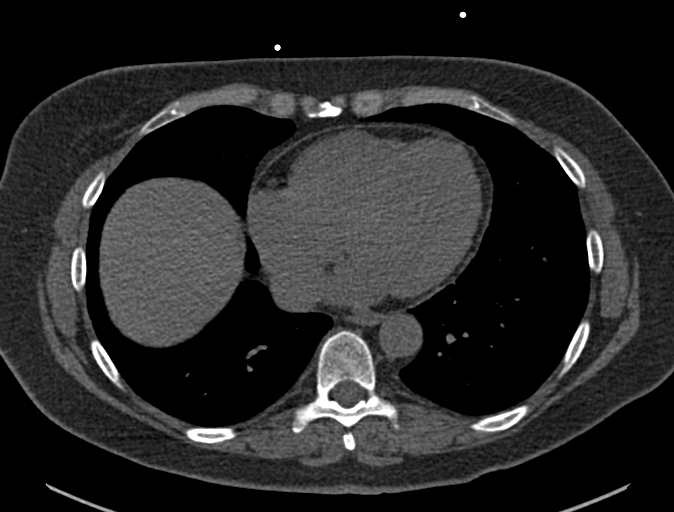
[im 40/80  vessel]
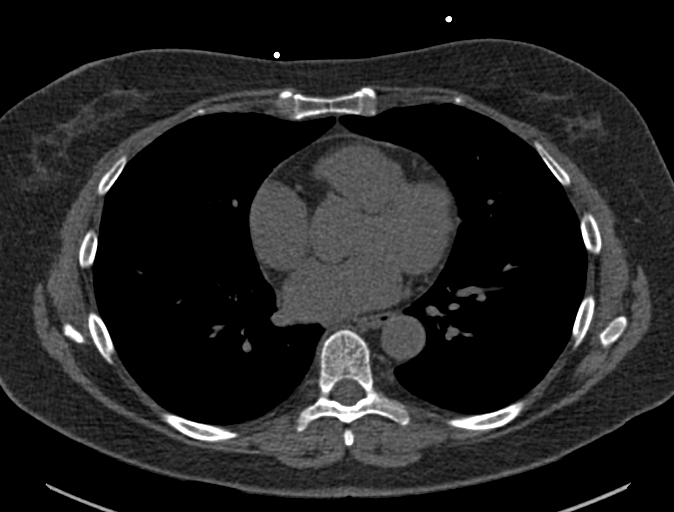
[im 53/80  vessel]
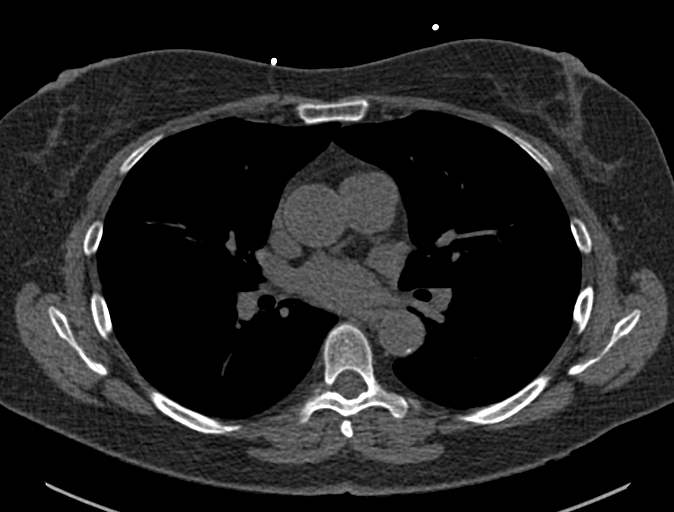
[im 66/80  vessel]
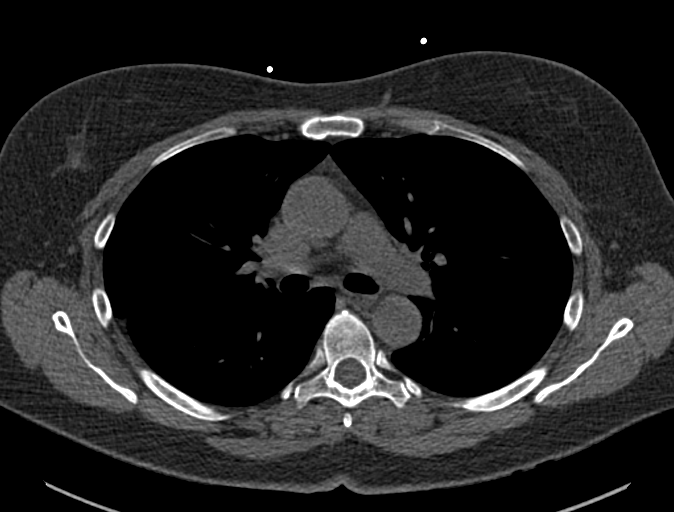
[im 66/80  lung]
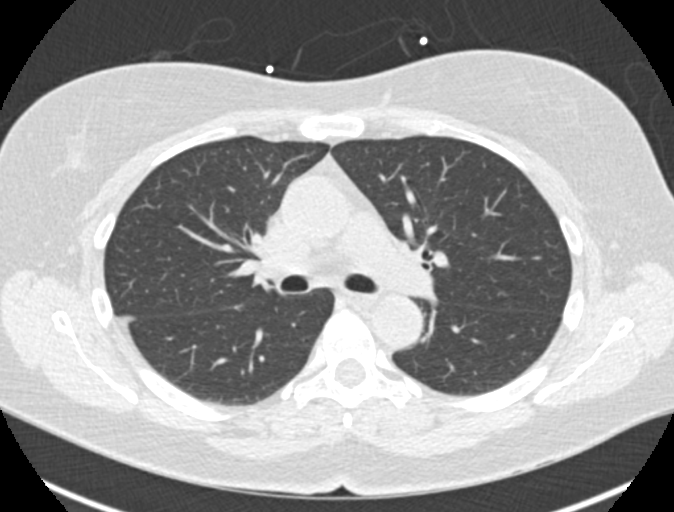

[Series 9: calcium scoring 2.00 br60 bestdiast 69% lungs · axial · 0.53mm/px · z∈[+1758,+1862]mm · 5 of 80 slices shown]
[im 14/80  vessel]
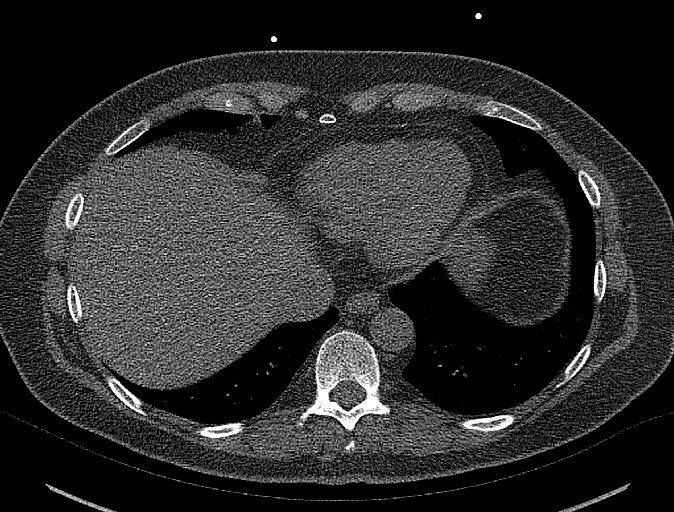
[im 27/80  vessel]
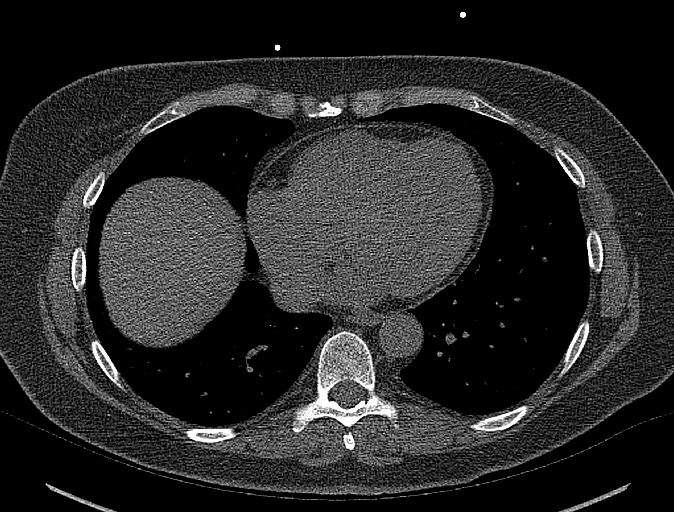
[im 40/80  vessel]
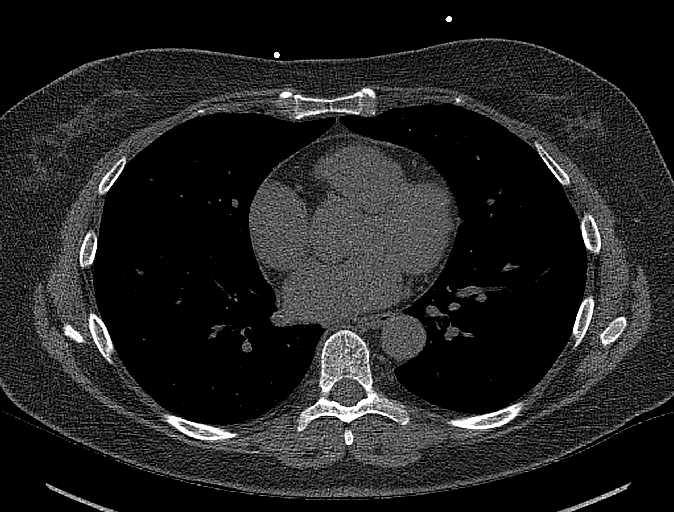
[im 53/80  vessel]
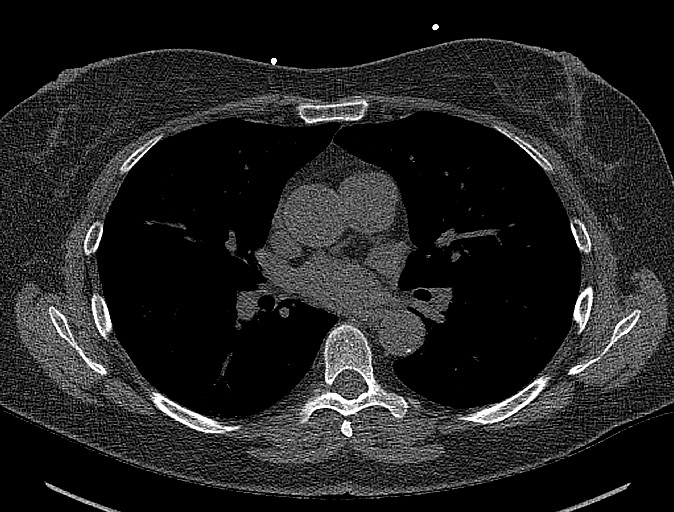
[im 66/80  vessel]
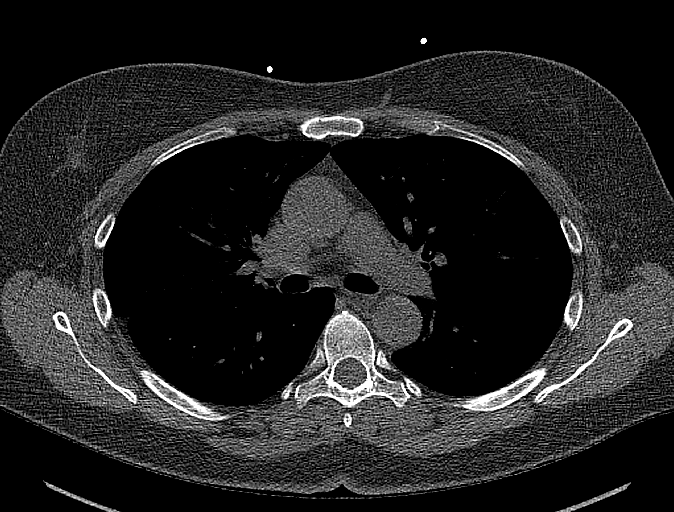

[12 of 20 positions shown; findings below may reference images not displayed]

FINDINGS: CORONARY CALCIUM SCORES:

Left Main: 0

LAD:

LCx: 0

RCA: 0

Total Agatston Score: 4.6 for

[HOSPITAL] percentile: 74, as calculated on the [HOSPITAL]
calcium calculator.

AORTA MEASUREMENTS:

Ascending Aorta: 33 mm

Descending Aorta: 25 mm

OTHER FINDINGS:

Cardiovascular: Calcified atheromatous plaque of the thoracic aorta
which is nonaneurysmal with respect to visualized portions. No
pericardial effusion. Normal heart size. Central pulmonary
vasculature is of normal caliber. Limited assessment of
cardiovascular structures given lack of intravenous contrast.

Mediastinum/Nodes: Unremarkable to the extent evaluated, imaging
focus is on the mid chest and cardiac structures only.

Lungs/Pleura: Basilar atelectasis. No effusion or consolidative
changes. Amidst atelectatic changes there are small more dense
nodular foci, potentially small pulmonary nodules obscured the lung
bases by atelectasis. Discrete nodules are also visualized (image
66/9) 2 mm to 3 mm size.

(Image 80/9) 2-3 mm also.

Area measuring potentially as much as 4-5 mm underlying atelectatic
changes (image 47/9) another 4 mm nodule potentially located in the
RIGHT lower lobe also area obscured by respiratory motion and
atelectatic changes. (Image 57/9) 5 mm potential RIGHT lower lobe
pulmonary nodule amidst atelectatic changes as well.

Upper Abdomen: Unremarkable to the extent evaluated, limited
assessment.

Musculoskeletal: No acute or destructive bone process.
IMPRESSION: 1. Coronary artery calcium score of 4.6 for [HOSPITAL] percentile
of 74 as calculated on the [HOSPITAL] calcium calculator.
2. Small basilar pulmonary nodules and nodular areas obscured by
basilar atelectasis. No follow-up needed if patient is low-risk (and
has no known or suspected primary neoplasm). Non-contrast chest CT
can be considered in 12 months if patient is high-risk. This
recommendation follows the consensus statement: Guidelines for
Management of Incidental Pulmonary Nodules Detected on CT Images:
3. Aortic atherosclerosis.

Aortic Atherosclerosis ([id number]).

## 2022-02-11 ENCOUNTER — Telehealth: Payer: Self-pay

## 2022-02-11 NOTE — Telephone Encounter (Signed)
LVM for pt to call me back to schedule sleep study  

## 2022-02-17 ENCOUNTER — Telehealth: Payer: Self-pay

## 2022-02-17 NOTE — Telephone Encounter (Signed)
Returned call and LVM for pt to call me back to schedule sleep study ? ?

## 2022-02-19 ENCOUNTER — Telehealth: Payer: Self-pay

## 2022-02-19 NOTE — Telephone Encounter (Signed)
Returned pt's call and LVM for pt to call me back to schedule sleep study ° °

## 2022-02-26 ENCOUNTER — Ambulatory Visit: Payer: PRIVATE HEALTH INSURANCE | Admitting: Neurology

## 2022-02-26 DIAGNOSIS — G4733 Obstructive sleep apnea (adult) (pediatric): Secondary | ICD-10-CM | POA: Diagnosis not present

## 2022-02-26 DIAGNOSIS — R519 Headache, unspecified: Secondary | ICD-10-CM

## 2022-02-26 DIAGNOSIS — R0683 Snoring: Secondary | ICD-10-CM

## 2022-02-26 DIAGNOSIS — R351 Nocturia: Secondary | ICD-10-CM

## 2022-02-26 DIAGNOSIS — E663 Overweight: Secondary | ICD-10-CM

## 2022-02-26 DIAGNOSIS — G4719 Other hypersomnia: Secondary | ICD-10-CM

## 2022-02-28 NOTE — Progress Notes (Signed)
? ?  [facility] ? ?HOME SLEEP TEST (Watch PAT) REPORT ? ?STUDY DATE: 02/26/2022 ? ?DOB: 03/22/1966 ? ?MRN: [id number] ? ?ORDERING CLINICIAN: Tisa Weisel, MD, PhD ?  ?REFERRING CLINICIAN: Dr. Tisovec ? ?CLINICAL INFORMATION/HISTORY: 55-year-old right-handed woman with an underlying medical history of migraines, tinnitus, anxiety, allergies, hypertension, degenerative lumbar spine disease, and borderline overweight state, who reports snoring and excessive daytime somnolence.  ? ?Epworth sleepiness score: 1/24. ? ?BMI: 25.1 kg/m? ? ?FINDINGS:  ? ?Sleep Summary:  ? ?Total Recording Time (hours, min): 7 hours, 44 minutes ? ?Total Sleep Time (hours, min):  6 hours, 58 minutes  ? ?Percent REM (%):    28.4%  ? ?Respiratory Indices:  ? ?Calculated pAHI (per hour):  19.4/hour        ? ?REM pAHI:    35.5/hour      ? ?NREM pAHI: 13/hour ? ?Oxygen Saturation Statistics:  ?  ?Oxygen Saturation (%) Mean: 94%  ? ?Minimum oxygen saturation (%):                 81%  ? ?O2 Saturation Range (%): 81-99%   ? ?O2 Saturation (minutes) <=88%: 0.7 min ? ?Pulse Rate Statistics:  ? ?Pulse Mean (bpm):    66/min   ? ?Pulse Range (55-92/min)  ? ?IMPRESSION: OSA (obstructive sleep apnea)  ? ?RECOMMENDATION:  ?This home sleep test demonstrates moderate obstructive sleep apnea with a total AHI of 19.4/hour and O2 nadir of 81%.  Moderate to loud snoring was detected, the snore sensor was dislodged for approximately one quarter of the night.  Treatment with positive airway pressure is recommended. The patient will be advised to proceed with an autoPAP titration/trial at home for now. A full night titration study may be considered to optimize treatment settings, if needed down the road. Please note that untreated obstructive sleep apnea may carry additional perioperative morbidity. Patients with significant obstructive sleep apnea should receive perioperative PAP therapy and the surgeons and particularly the anesthesiologist should be  informed of the diagnosis and the severity of the sleep disordered breathing. ?The patient should be cautioned not to drive, work at heights, or operate dangerous or heavy equipment when tired or sleepy. Review and reiteration of good sleep hygiene measures should be pursued with any patient. ?Other causes of the patient's symptoms, including circadian rhythm disturbances, an underlying mood disorder, medication effect and/or an underlying medical problem cannot be ruled out based on this test. Clinical correlation is recommended. The patient and her referring provider will be notified of the test results. The patient will be seen in follow up in sleep clinic at [facility]. ? ?I certify that I have reviewed the raw data recording prior to the issuance of this report in accordance with the standards of the American Academy of Sleep Medicine (AASM). ? ? ?INTERPRETING PHYSICIAN:  ? ?Aylin Rhoads, MD, PhD  ?Board Certified in Neurology and Sleep Medicine ? ?[facility] ?[address], [address] ?[place], [place] [address] ?[phone] ? ? ? ? ? ? ? ? ? ? ? ? ? ? ? ? ? ? ? ?

## 2022-03-03 NOTE — Addendum Note (Signed)
Addended by: Huston Foley on: 03/03/2022 05:04 PM ? ? Modules accepted: Orders ? ?

## 2022-03-03 NOTE — Procedures (Signed)
? ?  [facility] ? ?HOME SLEEP TEST (Watch PAT) REPORT ? ?STUDY DATE: 02/26/2022 ? ?DOB: 07-27-1966 ? ?MRN: [id number] ? ?ORDERING CLINICIAN: Star Age, MD, PhD ?  ?REFERRING CLINICIAN: Dr. Osborne Casco ? ?CLINICAL INFORMATION/HISTORY: 56 year old right-handed woman with an underlying medical history of migraines, tinnitus, anxiety, allergies, hypertension, degenerative lumbar spine disease, and borderline overweight state, who reports snoring and excessive daytime somnolence.  ? ?Epworth sleepiness score: 1/24. ? ?BMI: 25.1 kg/m? ? ?FINDINGS:  ? ?Sleep Summary:  ? ?Total Recording Time (hours, min): 7 hours, 44 minutes ? ?Total Sleep Time (hours, min):  6 hours, 58 minutes  ? ?Percent REM (%):    28.4%  ? ?Respiratory Indices:  ? ?Calculated pAHI (per hour):  19.4/hour        ? ?REM pAHI:    35.5/hour      ? ?NREM pAHI: 13/hour ? ?Oxygen Saturation Statistics:  ?  ?Oxygen Saturation (%) Mean: 94%  ? ?Minimum oxygen saturation (%):                 81%  ? ?O2 Saturation Range (%): 81-99%   ? ?O2 Saturation (minutes) <=88%: 0.7 min ? ?Pulse Rate Statistics:  ? ?Pulse Mean (bpm):    66/min   ? ?Pulse Range (55-92/min)  ? ?IMPRESSION: OSA (obstructive sleep apnea)  ? ?RECOMMENDATION:  ?This home sleep test demonstrates moderate obstructive sleep apnea with a total AHI of 19.4/hour and O2 nadir of 81%.  Moderate to loud snoring was detected, the snore sensor was dislodged for approximately one quarter of the night.  Treatment with positive airway pressure is recommended. The patient will be advised to proceed with an autoPAP titration/trial at home for now. A full night titration study may be considered to optimize treatment settings, if needed down the road. Please note that untreated obstructive sleep apnea may carry additional perioperative morbidity. Patients with significant obstructive sleep apnea should receive perioperative PAP therapy and the surgeons and particularly the anesthesiologist should be  informed of the diagnosis and the severity of the sleep disordered breathing. ?The patient should be cautioned not to drive, work at heights, or operate dangerous or heavy equipment when tired or sleepy. Review and reiteration of good sleep hygiene measures should be pursued with any patient. ?Other causes of the patient's symptoms, including circadian rhythm disturbances, an underlying mood disorder, medication effect and/or an underlying medical problem cannot be ruled out based on this test. Clinical correlation is recommended. The patient and her referring provider will be notified of the test results. The patient will be seen in follow up in sleep clinic at [facility]. ? ?I certify that I have reviewed the raw data recording prior to the issuance of this report in accordance with the standards of the American Academy of Sleep Medicine (AASM). ? ? ?INTERPRETING PHYSICIAN:  ? ?Star Age, MD, PhD  ?Board Certified in Neurology and Sleep Medicine ? ?[facility] ?[address], [address] ?[place], [place] [address] ?[phone] ? ? ? ? ? ? ? ? ? ? ? ? ? ? ? ? ? ? ? ?

## 2022-03-04 ENCOUNTER — Telehealth: Payer: Self-pay | Admitting: *Deleted

## 2022-03-04 NOTE — Telephone Encounter (Signed)
-----   Message from Huston Foley, MD sent at 03/03/2022  5:04 PM EST ----- ?Patient referred by Dr. Wylene Simmer, seen by me on 01/30/2022, patient had a HST on 02/26/2022.   ? ?Please call and notify the patient that the recent home sleep test showed obstructive sleep apnea in the moderate range. I recommend treatment in the form of autoPAP, which means, that we don't have to bring her in for a sleep study with CPAP, but will let her start using a so called autoPAP machine at home, which is a CPAP-like machine with self-adjusting pressures. We will send the order to a local DME company (of her choice, or as per insurance requirement). The DME representative will fit her with a mask, educate her on how to use the machine, how to put the mask on, etc. I have placed an order in the chart. Please send the order, talk to patient, send report to referring MD. We will need a FU in sleep clinic for 10 weeks post-PAP set up, please arrange that with me or one of our NPs. Also reinforce the need for compliance with treatment. Thanks,  ? ?Huston Foley, MD, PhD ?[facility]) ? ? ? ? ?

## 2022-03-04 NOTE — Telephone Encounter (Signed)
Spoke with briefly, but she was with a client.  She is aware the recommendation is for CPAP. Unable to finish conversation. We decided to communicate via MyChart.  ? ?Mychart message sent to patient.  ?

## 2022-03-06 NOTE — Telephone Encounter (Signed)
autoPAP order sent to [facility].  ?

## 2022-05-08 ENCOUNTER — Telehealth: Payer: Self-pay | Admitting: Neurology

## 2022-05-08 NOTE — Telephone Encounter (Signed)
Reminder sent for pt to bring autopap & power cord. ?

## 2022-05-12 ENCOUNTER — Encounter: Payer: Self-pay | Admitting: Neurology

## 2022-05-12 ENCOUNTER — Ambulatory Visit: Payer: PRIVATE HEALTH INSURANCE | Admitting: Neurology

## 2022-05-12 VITALS — BP 138/82 | HR 72 | Ht 66.0 in | Wt 168.0 lb

## 2022-05-12 DIAGNOSIS — G4733 Obstructive sleep apnea (adult) (pediatric): Secondary | ICD-10-CM

## 2022-05-12 DIAGNOSIS — Z9989 Dependence on other enabling machines and devices: Secondary | ICD-10-CM | POA: Diagnosis not present

## 2022-05-12 NOTE — Progress Notes (Signed)
Subjective:  ?  ?Patient ID: Theresa Ibarra is a 56 y.o. female. ? ?HPI ? ? ?Interim history:  ? ?Theresa Ibarra is a 56 year old right-handed woman with an underlying medical history of erythematous, tinnitus, anxiety, allergies, hypertension, degenerative lumbar spine disease, status post surgery under Dr. Rolena Infante in August 2021, and borderline overweight state, who presents for follow-up consultation of her obstructive sleep apnea after interim testing and starting AutoPap therapy.  The patient is unaccompanied today.  I first met her at the request of her primary care physician on 01/30/2022, at which time she reported snoring and excessive daytime somnolence.  She was advised to proceed with a sleep study.  She had a home sleep test on 02/26/2022 which indicated moderate obstructive sleep apnea with an AHI of 19.4/h, O2 nadir 81% with moderate to loud snoring detected.  She was advised to proceed with AutoPap therapy.  Her set up date was 03/24/2022.  She has a ResMed air sense 11 AutoSet machine. ? ?Today, 05/12/2022: I reviewed her AutoPap compliance data from 04/08/2022 through 05/07/2022, which is a total of 30 days, during which time she used her machine 21 days with percent use days greater than 4 hours at 70%, indicating adequate compliance with an average usage of 6 hours and 46 minutes, residual AHI at goal at 2.8/h, 95th percentile of pressure at 10.8 cm with a range of 5 to 11 cm with EPR, leak acceptable with the 95th percentile at 5.2 L/min.  She reports doing well with her AutoPap, she has adjusted well to treatment.  First she had a smaller size mask, then she requested a medium which fits better.  She has a ResMed F30i hybrid style fullface mask.  She is doing well with regards to daytime energy and improvement in her daytime somnolence.  She had initial adjustment issues and was not sleeping very well, had recently received a steroid injection into the left hip for a recurrent issue with her bursitis and  also took a Medrol Dosepak which caused her quite a bit of side effects including difficulty sleeping, she eventually developed a UTI, she had palpitations.  She is glad that she does not have to take any more steroids and would like to avoid any future injections if possible.  She sees orthopedics for this.  She is exercising on a regular basis.  She has recently taken quite a few camping trips which resulted in gaps in her usage but she has figured out a way to take her machine even on her camping trips.  She is very motivated to continue with treatment. ? ?The patient's allergies, current medications, family history, past medical history, past social history, past surgical history and problem list were reviewed and updated as appropriate.  ? ?Previously:  ? ?01/30/22: (She) reports snoring and excessive daytime somnolence.  I reviewed your office note from 10/21/2021.  She had blood work through your office on 10/14/2021 and I reviewed the results: CBC showed benign findings.  Lipid panel showed total cholesterol of 192, triglycerides 97, HDL 48, LDL 125.  CMP showed glucose of 97, creatinine 0.8, BUN 9.  TSH on 10/21/2021 was normal at 2.49.  Her Epworth sleepiness score is 1/24, fatigue severity score is 25 out of 63.  She does not always wake up rested.  She denies any nocturnal cough or reflux symptoms, denies symptoms of restless leg syndrome or leg twitching at night.  She works as a Theme park manager.  She is married and lives with  her husband, they have 2 grown sons and 4 grandsons.  They have 1 dog in the household.  She does have a TV in her bedroom but not on late at night.  She likes to read before falling asleep.  Bedtime is generally between 11 and 11:30 PM and rise time around 7 AM, she typically does not have to set an alarm, typically work starts at 10 AM.  Her husband is a Social research officer, government so sleep is interrupted by his alarm sometimes but sometimes she does not seem to even hear it.  She is a  non-smoker and drinks alcohol occasionally.  She drinks 1 large cup of coffee in the morning.  Her husband is on a CPAP machine but has residual snoring.  She is a restless sleeper per husband's feedback, she seems to toss and turn a lot.  She has woken up occasionally with a headache in the back of her head, seems dull and achy, sometimes she takes ibuprofen for this.  In the past, she had migraines but they improved after she went through menopause.  She does endorse stress, she lost her father-in-law in December and mother-in-law is needing more help, is in in-home dialysis. ?Patient uses a white noise machine in her bedroom and an air purifier.  She has nocturia about once per average night.  ? ? ?Her Past Medical History Is Significant For: ?Past Medical History:  ?Diagnosis Date  ? Anemia   ? Anxiety   ? Arthritis   ? Change in vision   ? Complication of anesthesia   ? 36 yrs ago after a c-section she was hard to wake up and had high blood pressure.  ? Dizziness   ? Family history of breast cancer   ? Family history of colon cancer   ? HTN (hypertension)   ? Low back pain   ? Migraines   ? Palpitations   ? SOB (shortness of breath)   ? Tinnitus   ? ? ?Her Past Surgical History Is Significant For: ?Past Surgical History:  ?Procedure Laterality Date  ? [place]  ? [place]  ? LUMBAR LAMINECTOMY/DECOMPRESSION MICRODISCECTOMY Left 08/08/2020  ? Procedure: L5-S1 lumbar discectomy left side;  Surgeon: Melina Schools, MD;  Location: [facility];  Service: Orthopedics;  Laterality: Left;  2.5 hrs  ? TUBAL LIGATION    ? WISDOM TOOTH EXTRACTION    ? ? ?Her Family History Is Significant For: ?Family History  ?Problem Relation Age of Onset  ? Osteoarthritis Mother   ? Other Mother 35  ?     tractor accident  ? Osteoarthritis Father   ? COPD Father   ? Diabetes Sister   ? COPD Sister   ? Osteoarthritis Sister   ? Breast cancer Sister 38  ?     intraductal  ? Depression Sister   ? Mental illness Sister    ? CVA Sister   ? Stroke Sister 47  ? Hyperlipidemia Brother   ? Hypertension Brother   ? Heart disease Brother   ? Osteoarthritis Brother   ? CAD Brother   ? Heart attack Brother 51  ? Depression Brother   ? Alzheimer's disease Maternal Aunt   ? Other Maternal Uncle   ?     infant death  ? Breast cancer Paternal Aunt   ? Colon cancer Paternal Uncle   ? Breast cancer Paternal Grandmother   ? Diabetes Other   ? Heart disease Other   ?  Asthma Other   ? Arthritis Other   ? Hypertension Other   ? Cancer Other   ? Migraines Other   ? Sleep apnea Other   ? Obstructive Sleep Apnea Other   ? ? ?Her Social History Is Significant For: ?Social History  ? ?Socioeconomic History  ? Marital status: Married  ?  Spouse name: Not on file  ? Number of children: Not on file  ? Years of education: Not on file  ? Highest education level: Not on file  ?Occupational History  ? Not on file  ?Tobacco Use  ? Smoking status: Never  ? Smokeless tobacco: Never  ?Vaping Use  ? Vaping Use: Never used  ?Substance and Sexual Activity  ? Alcohol use: Yes  ?  Comment: occasional  ? Drug use: No  ? Sexual activity: Yes  ?  Comment: MARRIED  ?Other Topics Concern  ? Not on file  ?Social History Narrative  ? Not on file  ? ?Social Determinants of Health  ? ?Financial Resource Strain: Not on file  ?Food Insecurity: Not on file  ?Transportation Needs: Not on file  ?Physical Activity: Not on file  ?Stress: Not on file  ?Social Connections: Not on file  ? ? ?Her Allergies Are:  ?Allergies  ?Allergen Reactions  ? Gabapentin Nausea And Vomiting  ?:  ? ?Her Current Medications Are:  ?Outpatient Encounter Medications as of 05/12/2022  ?Medication Sig  ? fluticasone (FLONASE) 50 MCG/ACT nasal spray Place 1 spray into both nostrils daily as needed for allergies.  ? ondansetron (ZOFRAN) 4 MG tablet Take 1 tablet (4 mg total) by mouth every 8 (eight) hours as needed for nausea or vomiting.  ? sertraline (ZOLOFT) 50 MG tablet Take 50 mg by mouth daily.  ? ?No  facility-administered encounter medications on file as of 05/12/2022.  ?: ? ?Review of Systems:  ?Out of a complete 14 point review of systems, all are reviewed and negative with the exception of these symptoms as

## 2022-05-12 NOTE — Patient Instructions (Addendum)
It was nice to see you again today! ?I am glad to hear, that you have adjusted well to your autoPAP and benefit from treatment.  ?Please continue using your autoPAP regularly. While your insurance requires that you use PAP at least 4 hours each night on 70% of the nights, I recommend, that you not skip any nights and use it throughout the night if you can. Getting used to PAP and staying with the treatment long term does take time and patience and discipline. Untreated obstructive sleep apnea when it is moderate to severe can have an adverse impact on cardiovascular health and raise her risk for heart disease, arrhythmias, hypertension, congestive heart failure, stroke and diabetes. Untreated obstructive sleep apnea causes sleep disruption, nonrestorative sleep, and sleep deprivation. This can have an impact on your day to day functioning and cause daytime sleepiness and impairment of cognitive function, memory loss, mood disturbance, and problems focussing. Using PAP regularly can improve these symptoms. ?We can see you in 1 year, you can see one of our nurse practitioners.  ? ?

## 2022-12-01 ENCOUNTER — Other Ambulatory Visit (HOSPITAL_COMMUNITY): Payer: Self-pay | Admitting: Internal Medicine

## 2022-12-01 DIAGNOSIS — R600 Localized edema: Secondary | ICD-10-CM

## 2022-12-03 ENCOUNTER — Ambulatory Visit (HOSPITAL_COMMUNITY)
Admission: RE | Admit: 2022-12-03 | Discharge: 2022-12-03 | Disposition: A | Discharge status: Home / Self Care | Payer: Commercial Managed Care - HMO | Source: Ambulatory Visit | Attending: Internal Medicine | Admitting: Internal Medicine

## 2022-12-03 DIAGNOSIS — R600 Localized edema: Secondary | ICD-10-CM | POA: Diagnosis present

## 2023-01-19 DIAGNOSIS — M25561 Pain in right knee: Secondary | ICD-10-CM | POA: Diagnosis not present

## 2023-02-02 DIAGNOSIS — M7062 Trochanteric bursitis, left hip: Secondary | ICD-10-CM | POA: Diagnosis not present

## 2023-02-23 DIAGNOSIS — Z01419 Encounter for gynecological examination (general) (routine) without abnormal findings: Secondary | ICD-10-CM | POA: Diagnosis not present

## 2023-02-23 DIAGNOSIS — Z6825 Body mass index (BMI) 25.0-25.9, adult: Secondary | ICD-10-CM | POA: Diagnosis not present

## 2023-03-25 DIAGNOSIS — G4733 Obstructive sleep apnea (adult) (pediatric): Secondary | ICD-10-CM | POA: Diagnosis not present

## 2023-04-06 DIAGNOSIS — Z1231 Encounter for screening mammogram for malignant neoplasm of breast: Secondary | ICD-10-CM | POA: Diagnosis not present

## 2023-04-25 DIAGNOSIS — G4733 Obstructive sleep apnea (adult) (pediatric): Secondary | ICD-10-CM | POA: Diagnosis not present

## 2023-05-13 ENCOUNTER — Ambulatory Visit: Payer: PRIVATE HEALTH INSURANCE | Admitting: Family Medicine

## 2023-05-25 DIAGNOSIS — G4733 Obstructive sleep apnea (adult) (pediatric): Secondary | ICD-10-CM | POA: Diagnosis not present

## 2023-06-25 DIAGNOSIS — G4733 Obstructive sleep apnea (adult) (pediatric): Secondary | ICD-10-CM | POA: Diagnosis not present

## 2023-07-04 DIAGNOSIS — M79601 Pain in right arm: Secondary | ICD-10-CM | POA: Diagnosis not present

## 2023-07-04 DIAGNOSIS — S93402A Sprain of unspecified ligament of left ankle, initial encounter: Secondary | ICD-10-CM | POA: Diagnosis not present

## 2023-07-04 DIAGNOSIS — M5489 Other dorsalgia: Secondary | ICD-10-CM | POA: Diagnosis not present

## 2023-07-04 DIAGNOSIS — R030 Elevated blood-pressure reading, without diagnosis of hypertension: Secondary | ICD-10-CM | POA: Diagnosis not present

## 2023-07-04 DIAGNOSIS — S81811A Laceration without foreign body, right lower leg, initial encounter: Secondary | ICD-10-CM | POA: Diagnosis not present

## 2023-07-25 DIAGNOSIS — G4733 Obstructive sleep apnea (adult) (pediatric): Secondary | ICD-10-CM | POA: Diagnosis not present

## 2023-07-31 DIAGNOSIS — M7062 Trochanteric bursitis, left hip: Secondary | ICD-10-CM | POA: Diagnosis not present

## 2023-07-31 DIAGNOSIS — R5383 Other fatigue: Secondary | ICD-10-CM | POA: Diagnosis not present

## 2023-07-31 DIAGNOSIS — I10 Essential (primary) hypertension: Secondary | ICD-10-CM | POA: Diagnosis not present

## 2023-07-31 DIAGNOSIS — D692 Other nonthrombocytopenic purpura: Secondary | ICD-10-CM | POA: Diagnosis not present

## 2023-07-31 DIAGNOSIS — M791 Myalgia, unspecified site: Secondary | ICD-10-CM | POA: Diagnosis not present

## 2023-07-31 DIAGNOSIS — G4733 Obstructive sleep apnea (adult) (pediatric): Secondary | ICD-10-CM | POA: Diagnosis not present

## 2023-07-31 DIAGNOSIS — M5451 Vertebrogenic low back pain: Secondary | ICD-10-CM | POA: Diagnosis not present

## 2023-08-25 DIAGNOSIS — G4733 Obstructive sleep apnea (adult) (pediatric): Secondary | ICD-10-CM | POA: Diagnosis not present

## 2023-09-25 DIAGNOSIS — G4733 Obstructive sleep apnea (adult) (pediatric): Secondary | ICD-10-CM | POA: Diagnosis not present

## 2023-10-23 DIAGNOSIS — Z1389 Encounter for screening for other disorder: Secondary | ICD-10-CM | POA: Diagnosis not present

## 2023-10-23 DIAGNOSIS — Z1212 Encounter for screening for malignant neoplasm of rectum: Secondary | ICD-10-CM | POA: Diagnosis not present

## 2023-10-23 DIAGNOSIS — I10 Essential (primary) hypertension: Secondary | ICD-10-CM | POA: Diagnosis not present

## 2023-10-25 DIAGNOSIS — G4733 Obstructive sleep apnea (adult) (pediatric): Secondary | ICD-10-CM | POA: Diagnosis not present

## 2023-10-29 ENCOUNTER — Telehealth: Payer: Self-pay | Admitting: *Deleted

## 2023-10-29 NOTE — Progress Notes (Signed)
PATIENT: Theresa Ibarra DOB: 1966/11/13  REASON FOR VISIT: follow up HISTORY FROM: patient  Chief Complaint  Patient presents with   Follow-up    Pt in room 2. Here for cpap follow up. Pt reports she is not doing well with machine, pt said she removes mask while sleep.     HISTORY OF PRESENT ILLNESS:  11/02/23 ALL:  Theresa Ibarra is a 57 y.o. female here today for follow up for OSA on CPAP.  She was last seen by Dr Frances Furbish 04/2022 and doing fairly well. She reports that she has not been as consistent with usage, recently. She contributes this to multiple things. She is having more leaks with nasal pillow mask and reports taking it off during the night. She has tried using husbands FFM and feels she would do better with this style mask. She has not noted any significant benefit of using CPAP therapy but does recognize medical benefits, long term.     HISTORY: (copied from Dr Teofilo Pod previous note)  Theresa Ibarra is a 57 year old right-handed woman with an underlying medical history of erythematous, tinnitus, anxiety, allergies, hypertension, degenerative lumbar spine disease, status post surgery under Dr. Shon Baton in August 2021, and borderline overweight state, who presents for follow-up consultation of her obstructive sleep apnea after interim testing and starting AutoPap therapy.  The patient is unaccompanied today.  I first met her at the request of her primary care physician on 01/30/2022, at which time she reported snoring and excessive daytime somnolence.  She was advised to proceed with a sleep study.  She had a home sleep test on 02/26/2022 which indicated moderate obstructive sleep apnea with an AHI of 19.4/h, O2 nadir 81% with moderate to loud snoring detected.  She was advised to proceed with AutoPap therapy.  Her set up date was 03/24/2022.  She has a ResMed air sense 11 AutoSet machine.   Today, 05/12/2022: I reviewed her AutoPap compliance data from 04/08/2022 through 05/07/2022, which is a  total of 30 days, during which time she used her machine 21 days with percent use days greater than 4 hours at 70%, indicating adequate compliance with an average usage of 6 hours and 46 minutes, residual AHI at goal at 2.8/h, 95th percentile of pressure at 10.8 cm with a range of 5 to 11 cm with EPR, leak acceptable with the 95th percentile at 5.2 L/min.  She reports doing well with her AutoPap, she has adjusted well to treatment.  First she had a smaller size mask, then she requested a medium which fits better.  She has a ResMed F30i hybrid style fullface mask.  She is doing well with regards to daytime energy and improvement in her daytime somnolence.  She had initial adjustment issues and was not sleeping very well, had recently received a steroid injection into the left hip for a recurrent issue with her bursitis and also took a Medrol Dosepak which caused her quite a bit of side effects including difficulty sleeping, she eventually developed a UTI, she had palpitations.  She is glad that she does not have to take any more steroids and would like to avoid any future injections if possible.  She sees orthopedics for this.  She is exercising on a regular basis.  She has recently taken quite a few camping trips which resulted in gaps in her usage but she has figured out a way to take her machine even on her camping trips.  She is very motivated to  continue with treatment.   REVIEW OF SYSTEMS: Out of a complete 14 system review of symptoms, the patient complains only of the following symptoms, none and all other reviewed systems are negative.  ESS: 5/24  ALLERGIES: Allergies  Allergen Reactions   Gabapentin Nausea And Vomiting    HOME MEDICATIONS: Outpatient Medications Prior to Visit  Medication Sig Dispense Refill   fluticasone (FLONASE) 50 MCG/ACT nasal spray Place 1 spray into both nostrils daily as needed for allergies.     ondansetron (ZOFRAN) 4 MG tablet Take 1 tablet (4 mg total) by mouth  every 8 (eight) hours as needed for nausea or vomiting. 20 tablet 0   pregabalin (LYRICA) 75 MG capsule Take 75 mg by mouth 2 (two) times daily.     sertraline (ZOLOFT) 50 MG tablet Take 50 mg by mouth daily.     traMADol (ULTRAM) 50 MG tablet Take 50 mg by mouth every 6 (six) hours.     No facility-administered medications prior to visit.    PAST MEDICAL HISTORY: Past Medical History:  Diagnosis Date   Anemia    Anxiety    Arthritis    Change in vision    Complication of anesthesia    38 yrs ago after a c-section she was hard to wake up and had high blood pressure.   Dizziness    Family history of breast cancer    Family history of colon cancer    HTN (hypertension)    Low back pain    Migraines    Palpitations    SOB (shortness of breath)    Tinnitus     PAST SURGICAL HISTORY: Past Surgical History:  Procedure Laterality Date   CESAREAN SECTION  1986   CESAREAN SECTION  1987   LUMBAR LAMINECTOMY/DECOMPRESSION MICRODISCECTOMY Left 08/08/2020   Procedure: L5-S1 lumbar discectomy left side;  Surgeon: Venita Lick, MD;  Location: [facility] OR;  Service: Orthopedics;  Laterality: Left;  2.5 hrs   TUBAL LIGATION     WISDOM TOOTH EXTRACTION      FAMILY HISTORY: Family History  Problem Relation Age of Onset   Osteoarthritis Mother    Other Mother 22       tractor accident   Osteoarthritis Father    COPD Father    Diabetes Sister    COPD Sister    Osteoarthritis Sister    Breast cancer Sister 58       intraductal   Depression Sister    Mental illness Sister    CVA Sister    Stroke Sister 43   Hyperlipidemia Brother    Hypertension Brother    Heart disease Brother    Osteoarthritis Brother    CAD Brother    Heart attack Brother 10   Depression Brother    Alzheimer's disease Maternal Aunt    Other Maternal Uncle        infant death   Breast cancer Paternal Aunt    Colon cancer Paternal Uncle    Breast cancer Paternal Grandmother    Diabetes Other    Heart  disease Other    Asthma Other    Arthritis Other    Hypertension Other    Cancer Other    Migraines Other    Sleep apnea Other    Obstructive Sleep Apnea Other     SOCIAL HISTORY: Social History   Socioeconomic History   Marital status: Married    Spouse name: Not on file   Number of children: Not on file  Years of education: Not on file   Highest education level: Not on file  Occupational History   Not on file  Tobacco Use   Smoking status: Never   Smokeless tobacco: Never  Vaping Use   Vaping status: Never Used  Substance and Sexual Activity   Alcohol use: Yes    Comment: occasional   Drug use: No   Sexual activity: Yes    Comment: MARRIED  Other Topics Concern   Not on file  Social History Narrative   Not on file   Social Determinants of Health   Financial Resource Strain: Not on file  Food Insecurity: Not on file  Transportation Needs: Not on file  Physical Activity: Not on file  Stress: Not on file  Social Connections: Not on file  Intimate Partner Violence: Not on file     PHYSICAL EXAM  Vitals:   11/02/23 1258  BP: 135/73  Pulse: 81  Weight: 161 lb (73 kg)  Height: 5\' 8"  (1.727 m)   Body mass index is 24.48 kg/m.  Generalized: Well developed, in no acute distress  Cardiology: normal rate and rhythm, no murmur noted Respiratory: clear to auscultation bilaterally  Neurological examination  Mentation: Alert oriented to time, place, history taking. Follows all commands speech and language fluent Cranial nerve II-XII: Pupils were equal round reactive to light. Extraocular movements were full, visual field were full  Motor: The motor testing reveals 5 over 5 strength of all 4 extremities. Good symmetric motor tone is noted throughout.  Gait and station: Gait is normal.    DIAGNOSTIC DATA (LABS, IMAGING, TESTING) - I reviewed patient records, labs, notes, testing and imaging myself where available.      No data to display           Lab  Results  Component Value Date   WBC 6.4 08/07/2020   HGB 13.3 08/07/2020   HCT 41.1 08/07/2020   MCV 91.9 08/07/2020   PLT 161 08/07/2020      Component Value Date/Time   NA 141 08/07/2020 1055   K 4.1 08/07/2020 1055   CL 107 08/07/2020 1055   CO2 25 08/07/2020 1055   GLUCOSE 93 08/07/2020 1055   BUN 13 08/07/2020 1055   CREATININE 0.74 08/07/2020 1055   CALCIUM 9.2 08/07/2020 1055   GFRNONAA >60 08/07/2020 1055   GFRAA >60 08/07/2020 1055   No results found for: "CHOL", "HDL", "LDLCALC", "LDLDIRECT", "TRIG", "CHOLHDL" No results found for: "HGBA1C" No results found for: "VITAMINB12" No results found for: "TSH"   ASSESSMENT AND PLAN 57 y.o. year old female  has a past medical history of Anemia, Anxiety, Arthritis, Change in vision, Complication of anesthesia, Dizziness, Family history of breast cancer, Family history of colon cancer, HTN (hypertension), Low back pain, Migraines, Palpitations, SOB (shortness of breath), and Tinnitus. here with     ICD-10-CM   1. OSA on CPAP  G47.33 For home use only DME continuous positive airway pressure (CPAP)       Gerrit Halls was previously doing well on CPAP therapy but has not been as compliant with usage, recently. Compliance report reveals suboptimal use. She was encouraged to continue using CPAP nightly and for greater than 4 hours each night. We will update supply orders as indicated. She may contact DME to request FFM. Risks of untreated sleep apnea review and education materials provided. Healthy lifestyle habits encouraged. She will follow up in 6 months, sooner if needed. She verbalizes understanding and agreement with this  plan.    Orders Placed This Encounter  Procedures   For home use only DME continuous positive airway pressure (CPAP)    Supplies, would like to try a full face mask    Order Specific Question:   Length of Need    Answer:   Lifetime    Order Specific Question:   Patient has OSA or probable OSA     Answer:   Yes    Order Specific Question:   Is the patient currently using CPAP in the home    Answer:   Yes    Order Specific Question:   Settings    Answer:   Other see comments    Order Specific Question:   CPAP supplies needed    Answer:   Mask, headgear, cushions, filters, heated tubing and water chamber     No orders of the defined types were placed in this encounter.     Shawnie Dapper, FNP-C 11/02/2023, 1:12 PM [facility] [address], [address] [place], [place] [address] [phone]

## 2023-10-29 NOTE — Patient Instructions (Addendum)
Please continue using your CPAP regularly. While your insurance requires that you use CPAP at least 4 hours each night on 70% of the nights, I recommend, that you not skip any nights and use it throughout the night if you can. Getting used to CPAP and staying with the treatment long term does take time and patience and discipline. Untreated obstructive sleep apnea when it is moderate to severe can have an adverse impact on cardiovascular health and raise her risk for heart disease, arrhythmias, hypertension, congestive heart failure, stroke and diabetes. Untreated obstructive sleep apnea causes sleep disruption, nonrestorative sleep, and sleep deprivation. This can have an impact on your day to day functioning and cause daytime sleepiness and impairment of cognitive function, memory loss, mood disturbance, and problems focussing. Using CPAP regularly can improve these symptoms.  We will update supply orders, today.   Follow up in 6 months

## 2023-10-29 NOTE — Telephone Encounter (Signed)
LVM reminding pt to bring CPAP machine and power cord with her to appt 11/02/23 at 1pm w/ AL,NP.

## 2023-11-02 ENCOUNTER — Ambulatory Visit: Payer: 59 | Admitting: Family Medicine

## 2023-11-02 ENCOUNTER — Encounter: Payer: Self-pay | Admitting: Family Medicine

## 2023-11-02 VITALS — BP 135/73 | HR 81 | Ht 68.0 in | Wt 161.0 lb

## 2023-11-02 DIAGNOSIS — Z0000 Encounter for general adult medical examination without abnormal findings: Secondary | ICD-10-CM | POA: Diagnosis not present

## 2023-11-02 DIAGNOSIS — Z1389 Encounter for screening for other disorder: Secondary | ICD-10-CM | POA: Diagnosis not present

## 2023-11-02 DIAGNOSIS — I10 Essential (primary) hypertension: Secondary | ICD-10-CM | POA: Diagnosis not present

## 2023-11-02 DIAGNOSIS — Z1212 Encounter for screening for malignant neoplasm of rectum: Secondary | ICD-10-CM | POA: Diagnosis not present

## 2023-11-02 DIAGNOSIS — H9319 Tinnitus, unspecified ear: Secondary | ICD-10-CM | POA: Diagnosis not present

## 2023-11-02 DIAGNOSIS — D692 Other nonthrombocytopenic purpura: Secondary | ICD-10-CM | POA: Diagnosis not present

## 2023-11-02 DIAGNOSIS — G4733 Obstructive sleep apnea (adult) (pediatric): Secondary | ICD-10-CM

## 2023-11-02 DIAGNOSIS — G43009 Migraine without aura, not intractable, without status migrainosus: Secondary | ICD-10-CM | POA: Diagnosis not present

## 2023-11-02 DIAGNOSIS — Z1331 Encounter for screening for depression: Secondary | ICD-10-CM | POA: Diagnosis not present

## 2023-11-02 DIAGNOSIS — M51369 Other intervertebral disc degeneration, lumbar region without mention of lumbar back pain or lower extremity pain: Secondary | ICD-10-CM | POA: Diagnosis not present

## 2023-11-02 DIAGNOSIS — Z1339 Encounter for screening examination for other mental health and behavioral disorders: Secondary | ICD-10-CM | POA: Diagnosis not present

## 2023-11-02 DIAGNOSIS — Z23 Encounter for immunization: Secondary | ICD-10-CM | POA: Diagnosis not present

## 2023-11-02 DIAGNOSIS — Z803 Family history of malignant neoplasm of breast: Secondary | ICD-10-CM | POA: Diagnosis not present

## 2023-11-02 DIAGNOSIS — E7800 Pure hypercholesterolemia, unspecified: Secondary | ICD-10-CM | POA: Diagnosis not present

## 2023-11-02 DIAGNOSIS — R82998 Other abnormal findings in urine: Secondary | ICD-10-CM | POA: Diagnosis not present

## 2023-11-05 ENCOUNTER — Telehealth: Payer: Self-pay

## 2023-11-05 NOTE — Telephone Encounter (Signed)
Called pt to ask who her DME was and Updated it in her chart. Pt is using [facility] for her CPAP Supplies.

## 2023-11-25 DIAGNOSIS — G4733 Obstructive sleep apnea (adult) (pediatric): Secondary | ICD-10-CM | POA: Diagnosis not present

## 2023-12-25 DIAGNOSIS — G4733 Obstructive sleep apnea (adult) (pediatric): Secondary | ICD-10-CM | POA: Diagnosis not present

## 2024-05-09 ENCOUNTER — Ambulatory Visit: Payer: 59 | Admitting: Family Medicine

## 2024-05-10 ENCOUNTER — Encounter: Payer: Self-pay | Admitting: Nurse Practitioner

## 2024-05-10 ENCOUNTER — Encounter: Admitting: Obstetrics and Gynecology

## 2024-05-10 ENCOUNTER — Ambulatory Visit (INDEPENDENT_AMBULATORY_CARE_PROVIDER_SITE_OTHER): Admitting: Nurse Practitioner

## 2024-05-10 VITALS — BP 128/76 | HR 92 | Ht 67.0 in | Wt 164.0 lb

## 2024-05-10 DIAGNOSIS — Z780 Asymptomatic menopausal state: Secondary | ICD-10-CM

## 2024-05-10 DIAGNOSIS — N763 Subacute and chronic vulvitis: Secondary | ICD-10-CM

## 2024-05-10 DIAGNOSIS — Z01419 Encounter for gynecological examination (general) (routine) without abnormal findings: Secondary | ICD-10-CM

## 2024-05-10 DIAGNOSIS — Z1211 Encounter for screening for malignant neoplasm of colon: Secondary | ICD-10-CM

## 2024-05-10 MED ORDER — CLOBETASOL PROPIONATE 0.05 % EX OINT: 1.0000 | TOPICAL_OINTMENT | Freq: Two times a day (BID) | CUTANEOUS | 2 refills | Status: DC

## 2024-05-10 NOTE — Progress Notes (Signed)
 Theresa Ibarra March 12, 1966 [id number]   History:  58 y.o. G2P2 presents as new patient to establish care. Complains of chronic vulvar itching for about a year, using nystatin cream but it is not helping. Has been checked for yeast. Normal pap history. Sister with history of breast cancer x 2, sister neg genetic testing.   Gynecologic History Patient's last menstrual period was 02/27/2019.   Contraception/Family planning: post menopausal status Sexually active: Yes  Health Maintenance Last Pap: within 2 years. Results were: Normal per patient Last mammogram: 03/30/2024. Results were: Normal Last colonoscopy: Neg Cologuard 02/25/2021  Last Dexa: 2023. Results were: osteopenic Exercising: No. Active lifestyle Smoker: no   Past medical history, past surgical history, family history and social history were all reviewed and documented in the EPIC chart. Married. 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 2 sons, 4 grandsons. One son and family in [place] for 3 years.   ROS:  A ROS was performed and pertinent positives and negatives are included.  Exam:  Vitals:   05/10/24 0847  BP: 128/76  Pulse: 92  SpO2: 97%  Weight: 164 lb (74.4 kg)  Height: 5\' 7"  (1.702 m)   Body mass index is 25.69 kg/m.  General appearance:  Normal Thyroid :  Symmetrical, normal in size, without palpable masses or nodularity. Respiratory  Auscultation:  Clear without wheezing or rhonchi Cardiovascular  Auscultation:  Regular rate, without rubs, murmurs or gallops  Edema/varicosities:  Not grossly evident Abdominal  Soft,nontender, without masses, guarding or rebound.  Liver/spleen:  No organomegaly noted  Hernia:  None appreciated  Skin  Inspection:  Grossly normal Breasts: Examined lying and sitting.   Right: Without masses, retractions, nipple discharge or axillary adenopathy.   Left: Without masses, retractions, nipple discharge or axillary adenopathy. Pelvic: External genitalia:   Redness from clitoral hood to perineum              Urethra:  normal appearing urethra with no masses, tenderness or lesions              Bartholins and Skenes: normal                 Vagina: normal appearing vagina with normal color and discharge, no lesions              Cervix: no lesions Bimanual Exam:  Uterus:  no masses or tenderness              Adnexa: no mass, fullness, tenderness              Rectovaginal: Deferred              Anus:  normal, no lesions  Patient informed chaperone available to be present for breast and pelvic exam. Patient has requested no chaperone to be present. Patient has been advised what will be completed during breast and pelvic exam.   Assessment/Plan:  58 y.o. G2P2002 to establish care.   Well female exam with routine gynecological exam - Education provided on SBEs, importance of preventative screenings, current guidelines, high calcium diet, regular exercise, and multivitamin daily. Labs with PCP.   Postmenopausal - no HRT, no bleeding.   Screening for colon cancer - Plan: Cologuard. Neg Cologuard 01/2021. Average risk.   Chronic vulvitis - Plan: clobetasol ointment (TEMOVATE) 0.05 % twice weekly. Use BID x 1 week, then daily x 1 week, then twice weekly.   Screening for cervical cancer - Normal Pap history.  Will repeat at 3-year interval per guidelines.  Screening for breast cancer - Normal mammogram history.  Continue annual screenings.  Normal breast exam today.  Screening for osteoporosis - Average risk. Will plan DXA at age 72.   Return in about 1 year (around 05/10/2025) for Annual.    Andee Bamberger DNP, 9:12 AM 05/10/2024

## 2024-05-25 LAB — COLOGUARD: COLOGUARD: NEGATIVE

## 2024-05-26 ENCOUNTER — Ambulatory Visit: Payer: Self-pay | Admitting: Nurse Practitioner

## 2024-09-12 ENCOUNTER — Telehealth: Payer: Self-pay | Admitting: *Deleted

## 2024-09-12 NOTE — Telephone Encounter (Signed)
 Patient left message on triage line requesting a refill of sertraline 50 mg tab. Not previously prescribed by [facility].   Spoke with patient. Patient states she reached out to PCP and had Rx filled. No longer needed. Patient appreciative of call.   Routing to provider for final review. Patient is agreeable to disposition. Will close encounter.

## 2024-11-18 ENCOUNTER — Ambulatory Visit: Admitting: Nurse Practitioner

## 2024-11-18 ENCOUNTER — Encounter: Payer: Self-pay | Admitting: Nurse Practitioner

## 2024-11-18 VITALS — BP 118/80 | HR 82

## 2024-11-18 DIAGNOSIS — N9089 Other specified noninflammatory disorders of vulva and perineum: Secondary | ICD-10-CM

## 2024-11-18 DIAGNOSIS — N763 Subacute and chronic vulvitis: Secondary | ICD-10-CM

## 2024-11-18 LAB — WET PREP FOR TRICH, YEAST, CLUE

## 2024-11-18 MED ORDER — CLOBETASOL PROPIONATE 0.05 % EX OINT: 1.0000 | TOPICAL_OINTMENT | Freq: Two times a day (BID) | CUTANEOUS | 2 refills | Status: AC

## 2024-11-18 NOTE — Progress Notes (Signed)
   Acute Office Visit  Subjective:    Patient ID: Theresa Ibarra, female    DOB: 1966/12/24, 58 y.o.   MRN: [id number]   HPI 58 y.o. presents today for vulvar irritation and itching. Has experienced chronic vulvar itching for about 2 years now. Comes and goes. Has area near vaginal opening that tears and becomes very painful. Negative wet preps. Clobetasol  provided in May. Did feel this helped but if she did not use for a while symptoms returned. Does not feel like she has vaginal dryness.   Patient's last menstrual period was 02/27/2019.    Review of Systems  Constitutional: Negative.   Genitourinary:  Positive for genital sores and vaginal pain (Vulvar burning/itching). Negative for vaginal bleeding and vaginal discharge.       Objective:    Physical Exam Constitutional:      Appearance: Normal appearance.  Genitourinary:    Vagina: Normal.     Cervix: Normal.      BP 118/80   Pulse 82   LMP 02/27/2019 Comment: tubal ligation  SpO2 98%  Wt Readings from Last 3 Encounters:  05/10/24 164 lb (74.4 kg)  11/02/23 161 lb (73 kg)  05/12/22 168 lb (76.2 kg)        Zada Louder, CMA present as biomedical engineer.   Wet prep negative for pathogens  Assessment & Plan:   Problem List Items Addressed This Visit   None Visit Diagnoses       Chronic vulvitis    -  Primary   Relevant Medications   clobetasol  ointment (TEMOVATE ) 0.05 %   Other Relevant Orders   WET PREP FOR TRICH, YEAST, CLUE     Vulvar lesion       Relevant Orders   SureSwab HSV, Type 1/2 DNA, PCR      Plan: Exam consistent with lichen sclerosus. Discussed symptoms and management. Clobetasol  BID x 7 days, then daily x 7 days, then twice weekly. HSV culture obtained. Negative wet prep. If no improvement after 1 month, biopsy recommended.   Return if symptoms worsen or fail to improve.    Theresa DELENA Shutter DNP, 1:49 PM 11/18/2024

## 2024-11-20 LAB — SURESWAB HSV, TYPE 1/2 DNA, PCR
HSV 1 DNA: NOT DETECTED
HSV 2 DNA: NOT DETECTED

## 2024-11-21 ENCOUNTER — Ambulatory Visit: Payer: Self-pay | Admitting: Nurse Practitioner
